# Patient Record
Sex: Female | Born: 1995 | Race: Black or African American | Hispanic: No | Smoking: Never smoker
Health system: Southern US, Community
[De-identification: ages and names within clinical notes are randomized; demographics above are authoritative.]

## PROBLEM LIST (undated history)

## (undated) DIAGNOSIS — Z789 Other specified health status: Secondary | ICD-10-CM

## (undated) HISTORY — DX: Other specified health status: Z78.9

---
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
## 2014-02-14 ENCOUNTER — Emergency Department (HOSPITAL_COMMUNITY)
Admission: EM | Admit: 2014-02-14 | Discharge: 2014-02-14 | Disposition: A | Discharge status: Home / Self Care | Payer: Medicaid Other | Attending: Emergency Medicine | Admitting: Emergency Medicine

## 2014-02-14 ENCOUNTER — Encounter (HOSPITAL_COMMUNITY): Payer: Self-pay | Admitting: Emergency Medicine

## 2014-02-14 DIAGNOSIS — L03119 Cellulitis of unspecified part of limb: Principal | ICD-10-CM

## 2014-02-14 DIAGNOSIS — L0291 Cutaneous abscess, unspecified: Secondary | ICD-10-CM

## 2014-02-14 DIAGNOSIS — L02419 Cutaneous abscess of limb, unspecified: Secondary | ICD-10-CM | POA: Insufficient documentation

## 2014-02-14 MED ORDER — SULFAMETHOXAZOLE-TRIMETHOPRIM 800-160 MG PO TABS: 1.0000 | ORAL_TABLET | Freq: Two times a day (BID) | ORAL | Status: DC

## 2014-02-14 NOTE — ED Notes (Signed)
Pt states she thinks she has an abscess to rt inner thigh.

## 2014-02-14 NOTE — ED Notes (Signed)
Pt reports abscess to r inner thigh x 2 days.

## 2014-02-14 NOTE — ED Provider Notes (Signed)
CSN: [id number]     Arrival date & time 02/14/14  1536 History  This chart was scribed for Maudry Diego, MD by Martinique Peace, ED scribe.  This patient was seen in room APA17/APA17 and the patient's care was started at 4:22 PM.   Chief Complaint  Patient presents with  . Abscess    Patient is a 18 y.o. female presenting with abscess. The history is provided by the patient. No language interpreter was used.  Abscess Location:  Leg Leg abscess location:  R upper leg Size:  1 cm Abscess quality: induration and painful   Duration:  2 days Progression:  Worsening Chronicity:  New   HPI Comments: Samara A Timberman is a 18 y.o. female who presents to the Emergency Department complaining of an abscess on the upper inner thigh of her right leg onset 2 days ago. She has no prior history of similar symptoms. She also denies any chronic medical conditions.   History reviewed. No pertinent past medical history.  History reviewed. No pertinent past surgical history.  No family history on file.   History  Substance Use Topics  . Smoking status: Never Smoker   . Smokeless tobacco: Not on file  . Alcohol Use: No    OB History   Grav Para Term Preterm Abortions TAB SAB Ect Mult Living                   Review of Systems  Constitutional: Negative for appetite change.  HENT: Negative for congestion, ear discharge and sinus pressure.   Eyes: Negative for discharge.  Respiratory: Negative for cough.   Cardiovascular: Negative for chest pain.  Gastrointestinal: Negative for abdominal pain and diarrhea.  Genitourinary: Negative for frequency and hematuria.  Musculoskeletal: Negative for back pain.  Skin: Positive for wound (abscess). Negative for rash.  Neurological: Negative for seizures.  Psychiatric/Behavioral: Negative for hallucinations.      Allergies  Review of patient's allergies indicates no known allergies.  Home Medications   Prior to Admission medications   Not on  File   BP 98/64  Pulse 72  Temp(Src) 99.7 F (37.6 C) (Oral)  Resp 18  Ht 5\' 6"  (1.676 m)  Wt 120 lb (54.432 kg)  BMI 19.38 kg/m2  SpO2 100%  LMP 02/03/2014  Physical Exam  Constitutional: She is oriented to person, place, and time. She appears well-developed.  HENT:  Head: Normocephalic.  Eyes: Conjunctivae are normal.  Neck: No tracheal deviation present.  Cardiovascular:  No murmur heard. Musculoskeletal: Normal range of motion.  Neurological: She is oriented to person, place, and time.  Skin: Skin is warm.  1 cm indurated tender area in her right nner thigh superior region. Consistent with an abscess.   Psychiatric: She has a normal mood and affect.    ED Course  Procedures (including critical care time)  DIAGNOSTIC STUDIES: Oxygen Saturation is 100% on room air, normal by my interpretation.    COORDINATION OF CARE: 4:26 PM-Discussed treatment plan which includes I & D with pt at bedside and pt agreed to plan.     Labs Review Labs Reviewed - No data to display  Imaging Review No results found.   EKG Interpretation None      MDM   Final diagnoses:  None   The chart was scribed for me under my direct supervision.  I personally performed the history, physical, and medical decision making and all procedures in the evaluation of this patient.Elwyn Lade  Roderic Palau, MD 02/15/14 1534

## 2014-02-14 NOTE — Discharge Instructions (Signed)
Follow up in two days.  Clean area twice a day with soap and water,  Tylenol or motrin for pain

## 2014-02-14 NOTE — ED Notes (Signed)
Telfa, 4x4s and tape applied to wound.

## 2014-02-16 ENCOUNTER — Emergency Department (HOSPITAL_COMMUNITY)
Admission: EM | Admit: 2014-02-16 | Discharge: 2014-02-16 | Disposition: A | Discharge status: Home / Self Care | Payer: Medicaid Other | Attending: Emergency Medicine | Admitting: Emergency Medicine

## 2014-02-16 ENCOUNTER — Encounter (HOSPITAL_COMMUNITY): Payer: Self-pay | Admitting: Emergency Medicine

## 2014-02-16 DIAGNOSIS — Z4801 Encounter for change or removal of surgical wound dressing: Secondary | ICD-10-CM | POA: Insufficient documentation

## 2014-02-16 DIAGNOSIS — Z09 Encounter for follow-up examination after completed treatment for conditions other than malignant neoplasm: Secondary | ICD-10-CM

## 2014-02-16 DIAGNOSIS — Z79899 Other long term (current) drug therapy: Secondary | ICD-10-CM | POA: Insufficient documentation

## 2014-02-16 NOTE — ED Provider Notes (Signed)
Medical screening examination/treatment/procedure(s) were performed by non-physician practitioner and as supervising physician I was immediately available for consultation/collaboration.  Richarda Blade, MD 02/16/14 2352

## 2014-02-16 NOTE — ED Provider Notes (Signed)
CSN: [id number]     Arrival date & time 02/16/14  1708 History   First MD Initiated Contact with Patient 02/16/14 1853     Chief Complaint  Patient presents with  . Abscess     (Consider location/radiation/quality/duration/timing/severity/associated sxs/prior Treatment) Patient is a 18 y.o. female presenting with abscess. The history is provided by the patient.  Abscess Location:  Leg Leg abscess location:  R upper leg Abscess quality: draining   Red streaking: no    Eliot A Bogucki is a 18 y.o. female who presents to the ED for recheck of abscess to the inner aspect of the right thigh. She was here 2 days ago and had the area drained. Patient was concerned today because the area around the abscess felt hard.   History reviewed. No pertinent past medical history. History reviewed. No pertinent past surgical history. No family history on file. History  Substance Use Topics  . Smoking status: Never Smoker   . Smokeless tobacco: Not on file  . Alcohol Use: No   OB History   Grav Para Term Preterm Abortions TAB SAB Ect Mult Living                 Review of Systems Negative except as stated in HPI   Allergies  Review of patient's allergies indicates no known allergies.  Home Medications   Prior to Admission medications   Medication Sig Start Date End Date Taking? Authorizing Provider  sulfamethoxazole-trimethoprim (SEPTRA DS) 800-160 MG per tablet Take 1 tablet by mouth 2 (two) times daily. 02/14/14   Maudry Diego, MD   BP 117/63  Pulse 96  Temp(Src) 98.6 F (37 C) (Oral)  Resp 20  Ht 5\' 6"  (1.676 m)  Wt 120 lb (54.432 kg)  BMI 19.38 kg/m2  SpO2 99%  LMP 02/03/2014 Physical Exam  Nursing note and vitals reviewed. Constitutional: She is oriented to person, place, and time. She appears well-developed and well-nourished.  HENT:  Head: Normocephalic.  Eyes: EOM are normal.  Neck: Neck supple.  Cardiovascular: Normal rate.   Pulmonary/Chest: Effort normal.    Musculoskeletal: Normal range of motion.       Right upper leg: She exhibits tenderness.       Legs: Inner aspect of the right thigh with draining abscess. There is a firm area surrounding the wound that is mildly tender on exam. No red streaking.   Neurological: She is alert and oriented to person, place, and time. No cranial nerve deficit.  Skin: Skin is warm and dry.  Psychiatric: She has a normal mood and affect. Her behavior is normal.    ED Course  Procedures   MDM  18 y.o. female with improving abscess of right thigh s/p I&D 2 days ago. She will continue to take her antibiotics and ibuprofen and will apply warm wet compresses to the area or sit in warm tubs of water. She will return for worsening symptoms.    Medication List    ASK your doctor about these medications       sulfamethoxazole-trimethoprim 800-160 MG per tablet  Commonly known as:  SEPTRA DS  Take 1 tablet by mouth 2 (two) times daily.           [place], [place] 02/16/14 [phone]

## 2014-02-16 NOTE — Discharge Instructions (Signed)
Apply warm wet compresses to the area or sit in warm tubs of water. Continue the antibiotics. Return for any problems.

## 2014-02-16 NOTE — ED Notes (Signed)
Pt has abscess to right inner thigh

## 2014-05-19 ENCOUNTER — Encounter (HOSPITAL_COMMUNITY): Payer: Self-pay | Admitting: Emergency Medicine

## 2014-05-19 ENCOUNTER — Emergency Department (HOSPITAL_COMMUNITY)
Admission: EM | Admit: 2014-05-19 | Discharge: 2014-05-20 | Disposition: A | Discharge status: Home / Self Care | Payer: Self-pay | Attending: Emergency Medicine | Admitting: Emergency Medicine

## 2014-05-19 DIAGNOSIS — Z3202 Encounter for pregnancy test, result negative: Secondary | ICD-10-CM | POA: Insufficient documentation

## 2014-05-19 DIAGNOSIS — N63 Unspecified lump in unspecified breast: Secondary | ICD-10-CM | POA: Insufficient documentation

## 2014-05-19 DIAGNOSIS — R000 Tachycardia, unspecified: Secondary | ICD-10-CM | POA: Insufficient documentation

## 2014-05-19 DIAGNOSIS — F411 Generalized anxiety disorder: Secondary | ICD-10-CM | POA: Insufficient documentation

## 2014-05-19 DIAGNOSIS — Z792 Long term (current) use of antibiotics: Secondary | ICD-10-CM | POA: Insufficient documentation

## 2014-05-19 DIAGNOSIS — N6310 Unspecified lump in the right breast, unspecified quadrant: Secondary | ICD-10-CM

## 2014-05-19 DIAGNOSIS — R002 Palpitations: Secondary | ICD-10-CM | POA: Insufficient documentation

## 2014-05-19 DIAGNOSIS — F419 Anxiety disorder, unspecified: Secondary | ICD-10-CM

## 2014-05-19 DIAGNOSIS — R259 Unspecified abnormal involuntary movements: Secondary | ICD-10-CM | POA: Insufficient documentation

## 2014-05-19 MED ORDER — ALPRAZOLAM 0.5 MG PO TABS
0.2500 mg | ORAL_TABLET | Freq: Once | ORAL | Status: AC
  Administered 2014-05-20: 0.25 mg via ORAL
  Filled 2014-05-19: qty 1
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
## 2014-05-20 LAB — CBC WITH DIFFERENTIAL/PLATELET
BASOS ABS: 0 10*3/uL (ref 0.0–0.1)
BASOS PCT: 1 % (ref 0–1)
Eosinophils Absolute: 0.3 10*3/uL (ref 0.0–0.7)
Eosinophils Relative: 4 % (ref 0–5)
HEMATOCRIT: 32.6 % — AB (ref 36.0–46.0)
HEMOGLOBIN: 11 g/dL — AB (ref 12.0–15.0)
LYMPHS PCT: 36 % (ref 12–46)
Lymphs Abs: 2.6 10*3/uL (ref 0.7–4.0)
MCH: 26.9 pg (ref 26.0–34.0)
MCHC: 33.7 g/dL (ref 30.0–36.0)
MCV: 79.7 fL (ref 78.0–100.0)
MONO ABS: 0.6 10*3/uL (ref 0.1–1.0)
Monocytes Relative: 8 % (ref 3–12)
NEUTROS ABS: 3.8 10*3/uL (ref 1.7–7.7)
NEUTROS PCT: 51 % (ref 43–77)
Platelets: 226 10*3/uL (ref 150–400)
RBC: 4.09 MIL/uL (ref 3.87–5.11)
RDW: 12.8 % (ref 11.5–15.5)
WBC: 7.3 10*3/uL (ref 4.0–10.5)

## 2014-05-20 LAB — URINALYSIS, ROUTINE W REFLEX MICROSCOPIC
BILIRUBIN URINE: NEGATIVE
GLUCOSE, UA: NEGATIVE mg/dL
KETONES UR: NEGATIVE mg/dL
LEUKOCYTES UA: NEGATIVE
Nitrite: NEGATIVE
PH: 6 (ref 5.0–8.0)
PROTEIN: NEGATIVE mg/dL
Specific Gravity, Urine: 1.01 (ref 1.005–1.030)
Urobilinogen, UA: 0.2 mg/dL (ref 0.0–1.0)

## 2014-05-20 LAB — COMPREHENSIVE METABOLIC PANEL
ALBUMIN: 3.9 g/dL (ref 3.5–5.2)
ALK PHOS: 45 U/L (ref 39–117)
ALT: 6 U/L (ref 0–35)
AST: 14 U/L (ref 0–37)
Anion gap: 11 (ref 5–15)
BILIRUBIN TOTAL: 0.8 mg/dL (ref 0.3–1.2)
BUN: 8 mg/dL (ref 6–23)
CHLORIDE: 105 meq/L (ref 96–112)
CO2: 23 meq/L (ref 19–32)
Calcium: 9.1 mg/dL (ref 8.4–10.5)
Creatinine, Ser: 0.73 mg/dL (ref 0.50–1.10)
GFR calc Af Amer: >90 mL/min (ref >90)
Glucose, Bld: 146 mg/dL — ABNORMAL HIGH (ref 70–99)
POTASSIUM: 3.9 meq/L (ref 3.7–5.3)
SODIUM: 139 meq/L (ref 137–147)
Total Protein: 7.8 g/dL (ref 6.0–8.3)

## 2014-05-20 LAB — PREGNANCY, URINE: Preg Test, Ur: NEGATIVE

## 2014-05-20 LAB — ETHANOL: Alcohol, Ethyl (B): 31 mg/dL — ABNORMAL HIGH (ref 0–11)

## 2014-05-20 LAB — RAPID URINE DRUG SCREEN, HOSP PERFORMED
Amphetamines: NOT DETECTED
BENZODIAZEPINES: NOT DETECTED
Barbiturates: NOT DETECTED
COCAINE: NOT DETECTED
OPIATES: NOT DETECTED
TETRAHYDROCANNABINOL: NOT DETECTED

## 2014-05-20 LAB — URINE MICROSCOPIC-ADD ON

## 2014-05-20 MED ORDER — ALPRAZOLAM 0.25 MG PO TABS: 0.2500 mg | ORAL_TABLET | Freq: Three times a day (TID) | ORAL | Status: DC | PRN

## 2014-05-20 NOTE — Discharge Instructions (Signed)
Call and make an appointment to followup with her primary physician regarding your breast masses. You may need to have a mammogram performed. With excessive alcohol intake. Return for worsening palpitations, chest pain or for any concerns.

## 2014-05-24 ENCOUNTER — Ambulatory Visit: Payer: Medicaid Other | Admitting: Pediatrics

## 2014-06-03 ENCOUNTER — Other Ambulatory Visit (HOSPITAL_COMMUNITY): Payer: Self-pay | Admitting: Nurse Practitioner

## 2014-06-03 DIAGNOSIS — N6310 Unspecified lump in the right breast, unspecified quadrant: Secondary | ICD-10-CM

## 2014-06-11 ENCOUNTER — Ambulatory Visit (HOSPITAL_COMMUNITY)
Admission: RE | Admit: 2014-06-11 | Discharge: 2014-06-11 | Disposition: A | Discharge status: Home / Self Care | Payer: Self-pay | Source: Ambulatory Visit | Attending: Nurse Practitioner | Admitting: Nurse Practitioner

## 2014-06-11 ENCOUNTER — Other Ambulatory Visit (HOSPITAL_COMMUNITY): Payer: Self-pay | Admitting: Nurse Practitioner

## 2014-06-11 DIAGNOSIS — N6310 Unspecified lump in the right breast, unspecified quadrant: Secondary | ICD-10-CM

## 2014-06-11 DIAGNOSIS — N63 Unspecified lump in unspecified breast: Secondary | ICD-10-CM | POA: Insufficient documentation

## 2014-07-10 ENCOUNTER — Encounter (HOSPITAL_COMMUNITY): Payer: Self-pay | Admitting: Emergency Medicine

## 2014-07-10 ENCOUNTER — Emergency Department (HOSPITAL_COMMUNITY)
Admission: EM | Admit: 2014-07-10 | Discharge: 2014-07-10 | Disposition: A | Discharge status: Home / Self Care | Payer: Self-pay | Attending: Emergency Medicine | Admitting: Emergency Medicine

## 2014-07-10 DIAGNOSIS — Z79899 Other long term (current) drug therapy: Secondary | ICD-10-CM | POA: Insufficient documentation

## 2014-07-10 DIAGNOSIS — L729 Follicular cyst of the skin and subcutaneous tissue, unspecified: Secondary | ICD-10-CM

## 2014-07-10 DIAGNOSIS — L738 Other specified follicular disorders: Secondary | ICD-10-CM | POA: Insufficient documentation

## 2014-07-10 DIAGNOSIS — Z792 Long term (current) use of antibiotics: Secondary | ICD-10-CM | POA: Insufficient documentation

## 2014-07-10 DIAGNOSIS — L739 Follicular disorder, unspecified: Secondary | ICD-10-CM

## 2014-07-10 MED ORDER — MUPIROCIN 2 % EX OINT: TOPICAL_OINTMENT | CUTANEOUS | Status: DC

## 2014-07-10 MED ORDER — SULFAMETHOXAZOLE-TRIMETHOPRIM 800-160 MG PO TABS: 1.0000 | ORAL_TABLET | Freq: Two times a day (BID) | ORAL | Status: DC

## 2014-07-10 NOTE — Discharge Instructions (Signed)

## 2014-07-10 NOTE — ED Notes (Signed)
"  I have a sore in my nose"

## 2014-07-11 NOTE — ED Provider Notes (Signed)
CSN: [id number]     Arrival date & time 07/10/14  1620 History   First MD Initiated Contact with Patient 07/10/14 1803     Chief Complaint  Patient presents with  . Nose Problem    sore in right nare     (Consider location/radiation/quality/duration/timing/severity/associated sxs/prior Treatment) HPI   Joan Rose is a 18 y.o. female who presents to the Emergency Department complaining of "sore" inside her right nostril.  She states she noticed tenderness to touch of her right nostril that began one day prior to ED arrival.  She has not tried any therapies prior to ED arrival.  She denies using any new nasal medications or drug use, fever, chills, facial swelling or redness of the face.     History reviewed. No pertinent past medical history. History reviewed. No pertinent past surgical history. History reviewed. No pertinent family history. History  Substance Use Topics  . Smoking status: Never Smoker   . Smokeless tobacco: Not on file  . Alcohol Use: No   OB History   Grav Para Term Preterm Abortions TAB SAB Ect Mult Living                 Review of Systems  Constitutional: Negative for fever and chills.  HENT: Negative for congestion, facial swelling, mouth sores, nosebleeds, rhinorrhea and trouble swallowing.   Gastrointestinal: Negative for nausea and vomiting.  Musculoskeletal: Negative for arthralgias and joint swelling.  Skin: Positive for color change.       Pain and "sore" to the inside right nostril  Hematological: Negative for adenopathy.  All other systems reviewed and are negative.     Allergies  Review of patient's allergies indicates no known allergies.  Home Medications   Prior to Admission medications   Medication Sig Start Date End Date Taking? Authorizing Provider  ALPRAZolam (XANAX) 0.25 MG tablet Take 1 tablet (0.25 mg total) by mouth 3 (three) times daily as needed for anxiety. 05/20/14   Julianne Rice, MD  mupirocin ointment (BACTROBAN) 2  % Apply to the inside of the right nostril TID x 10 days 07/10/14   Sylvester Minton L. Faatima Tench, PA-C  sulfamethoxazole-trimethoprim (SEPTRA DS) 800-160 MG per tablet Take 1 tablet by mouth 2 (two) times daily. 02/14/14   Maudry Diego, MD  sulfamethoxazole-trimethoprim (SEPTRA DS) 800-160 MG per tablet Take 1 tablet by mouth 2 (two) times daily. For 10 days 07/10/14   Anterrio Mccleery L. Fatih Stalvey, PA-C   BP 110/57  Pulse 83  Temp(Src) 99.1 F (37.3 C) (Oral)  Resp 16  Ht 5\' 6"  (1.676 m)  Wt 120 lb (54.432 kg)  BMI 19.38 kg/m2  SpO2 97%  LMP 06/20/2014 Physical Exam  Nursing note and vitals reviewed. Constitutional: She is oriented to person, place, and time. She appears well-developed and well-nourished. No distress.  HENT:  Head: Normocephalic and atraumatic.  Nose: Sinus tenderness present. No mucosal edema or rhinorrhea. No epistaxis.  Neck: Normal range of motion. Neck supple.  Cardiovascular: Normal rate, regular rhythm and normal heart sounds.   No murmur heard. Pulmonary/Chest: Effort normal and breath sounds normal. No respiratory distress.  Musculoskeletal: Normal range of motion.  Lymphadenopathy:    She has no cervical adenopathy.  Neurological: She is alert and oriented to person, place, and time. She exhibits normal muscle tone. Coordination normal.  Skin: Skin is warm and dry. There is erythema.  Single, small pustule to the inner right nostril.  No edema or erythema noted to the face or external  nostril.      ED Course  Procedures (including critical care time) Labs Review Labs Reviewed - No data to display  Imaging Review No results found.   EKG Interpretation None      MDM   Final diagnoses:  Nasal folliculitis    Pt is well appearing. VSS.  Small pustule to the inner right nostril.  No external edema or erythema of the nose.  She agrees to warm compresses, rx for bactrim and bactroban.      Laaibah Wartman L. Vanessa [place], PA-C 07/11/14 1658

## 2014-07-11 NOTE — ED Provider Notes (Signed)
Medical screening examination/treatment/procedure(s) were performed by non-physician practitioner and as supervising physician I was immediately available for consultation/collaboration.   EKG Interpretation None        Maudry Diego, MD 07/11/14 1705

## 2014-07-13 ENCOUNTER — Encounter (HOSPITAL_COMMUNITY): Payer: Self-pay | Admitting: Emergency Medicine

## 2014-07-13 ENCOUNTER — Emergency Department (HOSPITAL_COMMUNITY)
Admission: EM | Admit: 2014-07-13 | Discharge: 2014-07-13 | Disposition: A | Discharge status: Home / Self Care | Payer: Self-pay | Attending: Emergency Medicine | Admitting: Emergency Medicine

## 2014-07-13 DIAGNOSIS — L739 Follicular disorder, unspecified: Secondary | ICD-10-CM

## 2014-07-13 DIAGNOSIS — L728 Other follicular cysts of the skin and subcutaneous tissue: Secondary | ICD-10-CM | POA: Insufficient documentation

## 2014-07-13 DIAGNOSIS — Z792 Long term (current) use of antibiotics: Secondary | ICD-10-CM | POA: Insufficient documentation

## 2014-07-13 DIAGNOSIS — R0982 Postnasal drip: Secondary | ICD-10-CM | POA: Insufficient documentation

## 2014-07-13 NOTE — ED Notes (Signed)
Noticed cyst on R upper leg 3 days ago.  Has had similar problem before which required lancing and wants it to be checked so it doesn't get as bad as the last one.

## 2014-07-13 NOTE — ED Provider Notes (Signed)
CSN: [id number]     Arrival date & time 07/13/14  1605 History   First MD Initiated Contact with Patient 07/13/14 1617     Chief Complaint  Patient presents with  . Cyst     (Consider location/radiation/quality/duration/timing/severity/associated sxs/prior Treatment) HPI  Joan Rose is a 18 y.o. female who presents to the Emergency Department for the second time this week, complaining of a red, tender area to her right thigh that she noticed three days ago.  She was seen here by me on 07/10/14 for a pustule to her right nostril and was prescribed Bactrim and Bactroban which she has not gotten filled yet.  She states she had a similar "cyst" on the same thigh in the past and states that she wanted to have it checked.  She denies fever, vomiting, chills or swelling.    History reviewed. No pertinent past medical history. History reviewed. No pertinent past surgical history. History reviewed. No pertinent family history. History  Substance Use Topics  . Smoking status: Never Smoker   . Smokeless tobacco: Not on file  . Alcohol Use: No   OB History   Grav Para Term Preterm Abortions TAB SAB Ect Mult Living                 Review of Systems  Constitutional: Negative for fever and chills.  Gastrointestinal: Negative for nausea, vomiting and abdominal pain.  Genitourinary: Negative for dysuria.  Musculoskeletal: Negative for arthralgias and joint swelling.  Skin: Positive for color change.       Localized area tenderness to the right thigh  Neurological: Negative for weakness and numbness.  Hematological: Negative for adenopathy.  All other systems reviewed and are negative.     Allergies  Review of patient's allergies indicates no known allergies.  Home Medications   Prior to Admission medications   Medication Sig Start Date End Date Taking? Authorizing Provider  ALPRAZolam (XANAX) 0.25 MG tablet Take 1 tablet (0.25 mg total) by mouth 3 (three) times daily as needed  for anxiety. 05/20/14   Julianne Rice, MD  mupirocin ointment (BACTROBAN) 2 % Apply to the inside of the right nostril TID x 10 days 07/10/14   Artemus Romanoff L. Tsuneo Faison, PA-C  sulfamethoxazole-trimethoprim (SEPTRA DS) 800-160 MG per tablet Take 1 tablet by mouth 2 (two) times daily. 02/14/14   Maudry Diego, MD  sulfamethoxazole-trimethoprim (SEPTRA DS) 800-160 MG per tablet Take 1 tablet by mouth 2 (two) times daily. For 10 days 07/10/14   Trelyn Vanderlinde L. Elta Angell, PA-C   BP 102/57  Pulse 73  Temp(Src) 99.1 F (37.3 C) (Oral)  Resp 20  Ht 5\' 6"  (1.676 m)  Wt 120 lb (54.432 kg)  BMI 19.38 kg/m2  SpO2 97%  LMP 06/20/2014 Physical Exam  Nursing note and vitals reviewed. Constitutional: She is oriented to person, place, and time. She appears well-developed and well-nourished. No distress.  HENT:  Head: Normocephalic and atraumatic.  Pustule to the mucosa of the right nostril appears to be improving, mild purulent drainage noted.  No erythema  Cardiovascular: Normal rate, regular rhythm and normal heart sounds.   No murmur heard. Pulmonary/Chest: Effort normal and breath sounds normal. No respiratory distress.  Neurological: She is alert and oriented to person, place, and time. She exhibits normal muscle tone. Coordination normal.  Skin: Skin is warm and dry. There is erythema.  Localized area of tenderness to the anterior right thigh.  No induration or flunctuance    ED Course  Procedures (including  critical care time) Labs Review Labs Reviewed - No data to display  Imaging Review No results found.   EKG Interpretation None      MDM   Final diagnoses:  Folliculitis    Pt seen here by me three days ago, has not gotten her medications filled.  She is well appearing, non-toxic.  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  I have advised her that she will need to get abx filled and continue warm compresses.  She agrees to plan and appears stable for  d/c    Kharson Rasmusson L. Vanessa [place], PA-C 07/13/14 1657

## 2014-07-13 NOTE — ED Provider Notes (Signed)
Medical screening examination/treatment/procedure(s) were performed by non-physician practitioner and as supervising physician I was immediately available for consultation/collaboration.   EKG Interpretation None        Maudry Diego, MD 07/13/14 1739

## 2014-07-13 NOTE — Discharge Instructions (Signed)

## 2014-09-13 NOTE — L&D Delivery Note (Signed)
Patient is 19 y.o. G1P0 [redacted]w[redacted]d admitted for severe pre-eclampsia with facial/UE swelling and HA. She was induced with cytotec x 3 and a foley bulb. Patient labored on her own until she was found to be complete. She received Mag infusion, starting 0200 12/12. She also was GBS unknown, so received adequate treatment with PCN. BPs were well controlled throughout labor and delivery and she only required hydralazine x 1.    Delivery Note At 8:00 PM a viable female was delivered via  (Presentation: Direct OA).  APGAR: 7, 7; weight 4 lb 6.4 oz (1997 g).   Placenta status: Spontaneous, intact.  Cord: 3 vessel cord.  Cord pH: 7.27.  Anesthesia:  Epidural Episiotomy:  None Lacerations:  2nd degree perineal Suture Repair: 3.0 monocryl Est. Blood Loss (mL):    Mom to postpartum.  Baby to NICU.  Stormy Card 08/25/2015, 8:21 PM

## 2014-09-25 NOTE — Patient Instructions (Signed)
Joan Rose  09/25/2014   Your procedure is scheduled on: 09/30/2014  Report to [facility] at  53  AM.  Call this number if you have problems the morning of surgery: [phone]   Remember:   Do not eat food or drink liquids after midnight.   Take these medicines the morning of surgery with A SIP OF WATER:  xanax   Do not wear jewelry, make-up or nail polish.  Do not wear lotions, powders, or perfumes.  Do not shave 48 hours prior to surgery. Men may shave face and neck.  Do not bring valuables to the hospital.  [facility] is not responsible for any belongings or valuables.               Contacts, dentures or bridgework may not be worn into surgery.  Leave suitcase in the car. After surgery it may be brought to your room.  For patients admitted to the hospital, discharge time is determined by your treatment team.               Patients discharged the day of surgery will not be allowed to drive home.  Name and phone number of your driver: family  Special Instructions: Shower using CHG 2 nights before surgery and the night before surgery.  If you shower the day of surgery use CHG.  Use special wash - you have one bottle of CHG for all showers.  You should use approximately 1/3 of the bottle for each shower.   Please read over the following fact sheets that you were given: Pain Booklet, Coughing and Deep Breathing, Surgical Site Infection Prevention, Anesthesia Post-op Instructions and Care and Recovery After Surgery Breast Biopsy A breast biopsy is a test during which a sample of tissue is taken from your breast. The breast tissue is looked at under a microscope for cancer cells.  BEFORE THE PROCEDURE  Make plans to have someone drive you home after the test.  Do not smoke for 2 weeks before the test. Stop smoking, if you smoke.  Do not drink alcohol for 24 hours before the test.  Wear a good support bra to the test. PROCEDURE  You may be given one of the  following:  A medicine to numb the breast area (local anesthetic).  A medicine to make you fall asleep (general anesthetic). There are different types of breast biopsies. They include:  Fine-needle aspiration.  A needle is put into the breast lump.  The needle takes out fluid and cells from the lump.  Ultrasound imaging may be used to help find the lump and to put the needle in the right spot.  Core-needle biopsy.  A needle is put into the breast lump.  The needle is put in your breast 3-6 times.  The needle removes breast tissue.  An ultrasound image or X-ray is often used to find the right spot to put in the needle.  Stereotactic biopsy.  X-rays and a computer are used to study X-ray pictures of the breast lump.  The computer finds where the needle needs to be put into the breast.  Tissue samples are taken out.  Vacuum-assisted biopsy.  A small cut (incision) is made in your breast.  A biopsy device is put through the cut and into the breast tissue.  The biopsy device draws abnormal breast tissue into the biopsy device.  A large tissue sample is often removed.  No stitches are needed.  Ultrasound-guided core-needle  biopsy.  Ultrasound imaging helps guide the needle into the area of the breast that is not normal.  A cut is made in the breast. The needle is put into the breast lump.  Tissue samples are taken out.  Open biopsy.  A large cut is made in the breast.  Your doctor will try to remove the whole breast lump or as much as possible. All tissue, fluid, or cell samples are looked at under a microscope.  AFTER THE PROCEDURE  You will be taken to an area to recover. You will be able to go home once you are doing well and are without problems.  You may have bruising on your breast. This is normal.  A pressure bandage (dressing) may be put on your breast for 24-48 hours. This type of bandage is wrapped tightly around your chest. It helps stop fluid  from building up underneath tissues. Document Released: 11/22/2011 Document Revised: 01/14/2014 Document Reviewed: 11/22/2011 [facility] Patient Information 2015 [place], [place]. This information is not intended to replace advice given to you by your health care provider. Make sure you discuss any questions you have with your health care provider. PATIENT INSTRUCTIONS POST-ANESTHESIA  IMMEDIATELY FOLLOWING SURGERY:  Do not drive or operate machinery for the first twenty four hours after surgery.  Do not make any important decisions for twenty four hours after surgery or while taking narcotic pain medications or sedatives.  If you develop intractable nausea and vomiting or a severe headache please notify your doctor immediately.  FOLLOW-UP:  Please make an appointment with your surgeon as instructed. You do not need to follow up with anesthesia unless specifically instructed to do so.  WOUND CARE INSTRUCTIONS (if applicable):  Keep a dry clean dressing on the anesthesia/puncture wound site if there is drainage.  Once the wound has quit draining you may leave it open to air.  Generally you should leave the bandage intact for twenty four hours unless there is drainage.  If the epidural site drains for more than 36-48 hours please call the anesthesia department.  QUESTIONS?:  Please feel free to call your physician or the hospital operator if you have any questions, and they will be happy to assist you.

## 2014-09-26 ENCOUNTER — Encounter (HOSPITAL_COMMUNITY)
Admission: RE | Admit: 2014-09-26 | Discharge: 2014-09-26 | Disposition: A | Discharge status: Home / Self Care | Payer: Self-pay | Source: Ambulatory Visit | Attending: General Surgery | Admitting: General Surgery

## 2014-09-26 ENCOUNTER — Encounter (HOSPITAL_COMMUNITY): Payer: Self-pay

## 2014-09-26 DIAGNOSIS — Z01818 Encounter for other preprocedural examination: Secondary | ICD-10-CM | POA: Insufficient documentation

## 2014-09-26 DIAGNOSIS — N63 Unspecified lump in breast: Secondary | ICD-10-CM | POA: Insufficient documentation

## 2014-09-26 LAB — BASIC METABOLIC PANEL
Anion gap: 4 — ABNORMAL LOW (ref 5–15)
BUN: 17 mg/dL (ref 6–23)
CHLORIDE: 108 meq/L (ref 96–112)
CO2: 26 mmol/L (ref 19–32)
CREATININE: 0.63 mg/dL (ref 0.50–1.10)
Calcium: 9.2 mg/dL (ref 8.4–10.5)
GFR calc Af Amer: >90 mL/min (ref >90)
GFR calc non Af Amer: >90 mL/min (ref >90)
Glucose, Bld: 84 mg/dL (ref 70–99)
Potassium: 4.2 mmol/L (ref 3.5–5.1)
Sodium: 138 mmol/L (ref 135–145)

## 2014-09-26 LAB — CBC WITH DIFFERENTIAL/PLATELET
BASOS ABS: 0 10*3/uL (ref 0.0–0.1)
Basophils Relative: 1 % (ref 0–1)
EOS ABS: 0.2 10*3/uL (ref 0.0–0.7)
Eosinophils Relative: 5 % (ref 0–5)
HCT: 33.3 % — ABNORMAL LOW (ref 36.0–46.0)
HEMOGLOBIN: 11.1 g/dL — AB (ref 12.0–15.0)
LYMPHS ABS: 2 10*3/uL (ref 0.7–4.0)
Lymphocytes Relative: 43 % (ref 12–46)
MCH: 27.3 pg (ref 26.0–34.0)
MCHC: 33.3 g/dL (ref 30.0–36.0)
MCV: 81.8 fL (ref 78.0–100.0)
MONO ABS: 0.5 10*3/uL (ref 0.1–1.0)
Monocytes Relative: 11 % (ref 3–12)
NEUTROS PCT: 40 % — AB (ref 43–77)
Neutro Abs: 1.8 10*3/uL (ref 1.7–7.7)
Platelets: 207 10*3/uL (ref 150–400)
RBC: 4.07 MIL/uL (ref 3.87–5.11)
RDW: 13.1 % (ref 11.5–15.5)
WBC: 4.6 10*3/uL (ref 4.0–10.5)

## 2014-09-26 LAB — HCG, SERUM, QUALITATIVE: PREG SERUM: NEGATIVE

## 2014-09-26 NOTE — Pre-Procedure Instructions (Signed)
Patient given information to sign up for my chart at home. 

## 2014-09-27 NOTE — OR Nursing (Signed)
EKG  Done on 9/56/2015  Showed to dr. Patsey Berthold.  Rate 127,  Will check patient on arrival to preop.  PAT hr was 70

## 2014-09-28 NOTE — H&P (Signed)
  NTS SOAP Note  Vital Signs:  Vitals as of: 0/12/5995: Systolic 741: Diastolic 61: Heart Rate 71: Temp 98.65F: Height 27ft 6in: Weight 121Lbs 0 Ounces: BMI 19.53  BMI : 19.53 kg/m2  Subjective: This 19 year old female presents for of a right breast lump.  Has been present for some time,  but seems to be increasing in size.  Also found another smaller lump in the same right breast recently.  No family h/o breast cancer.  Review of Symptoms:  Constitutional:unremarkable   Head:unremarkable Eyes:unremarkable   Nose/Mouth/Throat:unremarkable Cardiovascular:  unremarkable Respiratory:unremarkable Gastrointestinal:  unremarkable   Genitourinary:unremarkable   Musculoskeletal:unremarkable Skin:unremarkable Hematolgic/Lymphatic:unremarkable   Allergic/Immunologic:unremarkable   Past Medical History:  Reviewed  Past Medical History  Surgical History: none Medical Problems: none Allergies: nkda Medications: none   Social History:Reviewed  Social History  Preferred Language: English Race:  Black or African American Ethnicity: Not Hispanic / Latino Age: 11 year Marital Status:  S Alcohol: no   Smoking Status: Never smoker reviewed on 08/20/2014 Functional Status reviewed on 08/20/2014 ------------------------------------------------ Bathing: Normal Cooking: Normal Dressing: Normal Driving: Normal Eating: Normal Managing Meds: Normal Oral Care: Normal Shopping: Normal Toileting: Normal Transferring: Normal Walking: Normal Cognitive Status reviewed on 08/20/2014 ------------------------------------------------ Attention: Normal Decision Making: Normal Language: Normal Memory: Normal Motor: Normal Perception: Normal Problem Solving: Normal Visual and Spatial: Normal   Family History:Reviewed  Family Health History Mother, Living; Healthy;  Father, Living; Healthy;     Objective Information: General:Well appearing, well  nourished in no distress. Heart:RRR, no murmur or gallop.  Normal S1, S2.  No S3, S4.  Right breast with large oval mass in the lower outer quadrant involving a significant portion of the breast.  Small mobile 2cm mass noted in the upper,  outer quadrant of the right breast.  Right breast larger than the left breast.  Axilla negative.  Left breast unremarkable. U/S of right breast:  report reviewed. Assessment:Right breast neoplasms x 2   Diagnoses: 611.72  N63 Breast lump (Unspecified lump in breast)  Procedures: 42395 - OFFICE OUTPATIENT NEW 30 MINUTES    Plan:  Does have an enlarging mass in the right breast mass.  Small mass may be a complex cyst or fibroadenoma.  Right breast larger than the left breast.  Discussed surgical excision of both.

## 2014-09-30 ENCOUNTER — Encounter (HOSPITAL_COMMUNITY): Payer: Self-pay | Admitting: Anesthesiology

## 2014-09-30 ENCOUNTER — Encounter (HOSPITAL_COMMUNITY)
Admission: RE | Disposition: A | Discharge status: Home / Self Care | Payer: Self-pay | Source: Ambulatory Visit | Attending: General Surgery

## 2014-09-30 ENCOUNTER — Ambulatory Visit (HOSPITAL_COMMUNITY): Payer: Self-pay | Admitting: Anesthesiology

## 2014-09-30 ENCOUNTER — Ambulatory Visit (HOSPITAL_COMMUNITY)
Admission: RE | Admit: 2014-09-30 | Discharge: 2014-09-30 | Disposition: A | Discharge status: Home / Self Care | Payer: Self-pay | Source: Ambulatory Visit | Attending: General Surgery | Admitting: General Surgery

## 2014-09-30 DIAGNOSIS — D241 Benign neoplasm of right breast: Secondary | ICD-10-CM | POA: Insufficient documentation

## 2014-09-30 HISTORY — PX: BREAST BIOPSY: SHX20

## 2014-09-30 SURGERY — BREAST BIOPSY
Anesthesia: General | Site: Breast | Laterality: Right

## 2014-09-30 MED ORDER — LACTATED RINGERS IV SOLN
INTRAVENOUS | Status: DC
  Administered 2014-09-30: 1000 mL via INTRAVENOUS

## 2014-09-30 MED ORDER — FENTANYL CITRATE 0.05 MG/ML IJ SOLN
INTRAMUSCULAR | Status: AC
  Filled 2014-09-30: qty 2

## 2014-09-30 MED ORDER — BUPIVACAINE HCL (PF) 0.5 % IJ SOLN
INTRAMUSCULAR | Status: AC
  Filled 2014-09-30: qty 30

## 2014-09-30 MED ORDER — KETOROLAC TROMETHAMINE 30 MG/ML IJ SOLN
INTRAMUSCULAR | Status: AC
  Filled 2014-09-30: qty 1

## 2014-09-30 MED ORDER — LIDOCAINE HCL (PF) 1 % IJ SOLN
INTRAMUSCULAR | Status: AC
  Filled 2014-09-30: qty 30

## 2014-09-30 MED ORDER — MIDAZOLAM HCL 2 MG/2ML IJ SOLN
INTRAMUSCULAR | Status: AC
  Filled 2014-09-30: qty 2

## 2014-09-30 MED ORDER — FENTANYL CITRATE 0.05 MG/ML IJ SOLN
25.0000 ug | INTRAMUSCULAR | Status: AC
  Administered 2014-09-30 (×2): 25 ug via INTRAVENOUS

## 2014-09-30 MED ORDER — OXYCODONE-ACETAMINOPHEN 7.5-325 MG PO TABS: 1.0000 | ORAL_TABLET | ORAL | Status: DC | PRN

## 2014-09-30 MED ORDER — 0.9 % SODIUM CHLORIDE (POUR BTL) OPTIME
TOPICAL | Status: DC | PRN
  Administered 2014-09-30: 1000 mL

## 2014-09-30 MED ORDER — PROPOFOL 10 MG/ML IV BOLUS
INTRAVENOUS | Status: DC | PRN
  Administered 2014-09-30: 130 mg via INTRAVENOUS

## 2014-09-30 MED ORDER — BUPIVACAINE HCL (PF) 0.5 % IJ SOLN
INTRAMUSCULAR | Status: DC | PRN
  Administered 2014-09-30: 10 mL

## 2014-09-30 MED ORDER — FENTANYL CITRATE 0.05 MG/ML IJ SOLN
INTRAMUSCULAR | Status: AC
Start: 2014-09-30 — End: 2014-09-30
  Filled 2014-09-30: qty 2

## 2014-09-30 MED ORDER — FENTANYL CITRATE 0.05 MG/ML IJ SOLN: 25.0000 ug | INTRAMUSCULAR | Status: DC | PRN

## 2014-09-30 MED ORDER — KETOROLAC TROMETHAMINE 30 MG/ML IJ SOLN
30.0000 mg | Freq: Once | INTRAMUSCULAR | Status: AC
  Administered 2014-09-30: 30 mg via INTRAVENOUS

## 2014-09-30 MED ORDER — ONDANSETRON HCL 4 MG/2ML IJ SOLN
INTRAMUSCULAR | Status: AC
Start: 2014-09-30 — End: 2014-09-30
  Filled 2014-09-30: qty 2

## 2014-09-30 MED ORDER — LIDOCAINE HCL (CARDIAC) 20 MG/ML IV SOLN
INTRAVENOUS | Status: DC | PRN
  Administered 2014-09-30: 25 mg via INTRAVENOUS

## 2014-09-30 MED ORDER — PROPOFOL 10 MG/ML IV BOLUS
INTRAVENOUS | Status: AC
  Filled 2014-09-30: qty 20

## 2014-09-30 MED ORDER — ARTIFICIAL TEARS OP OINT
TOPICAL_OINTMENT | OPHTHALMIC | Status: AC
  Filled 2014-09-30: qty 3.5

## 2014-09-30 MED ORDER — ONDANSETRON HCL 4 MG/2ML IJ SOLN
4.0000 mg | Freq: Once | INTRAMUSCULAR | Status: AC
  Administered 2014-09-30: 4 mg via INTRAVENOUS

## 2014-09-30 MED ORDER — MIDAZOLAM HCL 2 MG/2ML IJ SOLN
1.0000 mg | INTRAMUSCULAR | Status: DC | PRN
  Administered 2014-09-30: 2 mg via INTRAVENOUS

## 2014-09-30 MED ORDER — LACTATED RINGERS IV SOLN
INTRAVENOUS | Status: DC | PRN
  Administered 2014-09-30 (×2): via INTRAVENOUS

## 2014-09-30 MED ORDER — CHLORHEXIDINE GLUCONATE 4 % EX LIQD: 1.0000 "application " | Freq: Once | CUTANEOUS | Status: DC

## 2014-09-30 MED ORDER — ONDANSETRON HCL 4 MG/2ML IJ SOLN: 4.0000 mg | Freq: Once | INTRAMUSCULAR | Status: DC | PRN

## 2014-09-30 MED ORDER — FENTANYL CITRATE 0.05 MG/ML IJ SOLN
INTRAMUSCULAR | Status: DC | PRN
  Administered 2014-09-30 (×2): 25 ug via INTRAVENOUS
  Administered 2014-09-30: 50 ug via INTRAVENOUS
  Administered 2014-09-30 (×4): 25 ug via INTRAVENOUS

## 2014-09-30 SURGICAL SUPPLY — 26 items
BAG HAMPER (MISCELLANEOUS) ×3 IMPLANT
CHLORAPREP W/TINT 26ML (MISCELLANEOUS) ×3 IMPLANT
CLOTH BEACON ORANGE TIMEOUT ST (SAFETY) ×3 IMPLANT
COVER LIGHT HANDLE STERIS (MISCELLANEOUS) ×6 IMPLANT
DECANTER SPIKE VIAL GLASS SM (MISCELLANEOUS) ×4 IMPLANT
ELECT REM PT RETURN 9FT ADLT (ELECTROSURGICAL) ×3
ELECTRODE REM PT RTRN 9FT ADLT (ELECTROSURGICAL) ×1 IMPLANT
FORMALIN 10 PREFIL 480ML (MISCELLANEOUS) ×2 IMPLANT
GLOVE BIOGEL PI IND STRL 7.5 (GLOVE) IMPLANT
GLOVE BIOGEL PI INDICATOR 7.5 (GLOVE) ×2
GLOVE EXAM NITRILE LRG STRL (GLOVE) ×2 IMPLANT
GLOVE SURG SS PI 7.5 STRL IVOR (GLOVE) ×6 IMPLANT
GOWN STRL REUS W/TWL LRG LVL3 (GOWN DISPOSABLE) ×6 IMPLANT
KIT ROOM TURNOVER APOR (KITS) ×3 IMPLANT
LIQUID BAND (GAUZE/BANDAGES/DRESSINGS) ×2 IMPLANT
MANIFOLD NEPTUNE II (INSTRUMENTS) ×3 IMPLANT
NDL HYPO 25X1 1.5 SAFETY (NEEDLE) ×1 IMPLANT
NEEDLE HYPO 25X1 1.5 SAFETY (NEEDLE) ×3 IMPLANT
NS IRRIG 1000ML POUR BTL (IV SOLUTION) ×3 IMPLANT
PACK MINOR (CUSTOM PROCEDURE TRAY) ×3 IMPLANT
PAD ARMBOARD 7.5X6 YLW CONV (MISCELLANEOUS) ×3 IMPLANT
SET BASIN LINEN APH (SET/KITS/TRAYS/PACK) ×3 IMPLANT
SUT VIC AB 3-0 SH 27 (SUTURE) ×3
SUT VIC AB 3-0 SH 27X BRD (SUTURE) ×1 IMPLANT
SUT VIC AB 4-0 PS2 27 (SUTURE) ×5 IMPLANT
SYR CONTROL 10ML LL (SYRINGE) ×2 IMPLANT

## 2014-09-30 NOTE — Anesthesia Preprocedure Evaluation (Signed)
Anesthesia Evaluation  Patient identified by MRN, date of birth, ID band Patient awake    Reviewed: Allergy & Precautions, NPO status , Patient's Chart, lab work & pertinent test results  Airway Mallampati: I  TM Distance: >3 FB     Dental  (+) Teeth Intact   Pulmonary neg pulmonary ROS,  breath sounds clear to auscultation        Cardiovascular negative cardio ROS  Rhythm:Regular Rate:Normal     Neuro/Psych    GI/Hepatic negative GI ROS,   Endo/Other    Renal/GU      Musculoskeletal   Abdominal   Peds  Hematology   Anesthesia Other Findings   Reproductive/Obstetrics                             Anesthesia Physical Anesthesia Plan  ASA: I  Anesthesia Plan: General   Post-op Pain Management:    Induction: Intravenous  Airway Management Planned: LMA  Additional Equipment:   Intra-op Plan:   Post-operative Plan: Extubation in OR  Informed Consent: I have reviewed the patients History and Physical, chart, labs and discussed the procedure including the risks, benefits and alternatives for the proposed anesthesia with the patient or authorized representative who has indicated his/her understanding and acceptance.     Plan Discussed with:   Anesthesia Plan Comments:         Anesthesia Quick Evaluation

## 2014-09-30 NOTE — Op Note (Signed)
Patient:  Joan Rose  DOB:  1996-01-22  MRN:  [id number]   Preop Diagnosis:  Right breast neoplasms, 2  Postop Diagnosis:  Same  Procedure:  Right breast biopsy  Surgeon:  Aviva Signs, M.D.  Anes:  Gen.   Indications:  Patient is a 19 year old black female who presents with an enlarging neoplasm the right breast. She has a significantly enlarged mass involving the inferior aspect of the right breast as well as a smaller but distinct 2 cm mass in the upper, outer quadrant of the right breast. She now presents for a right breast biopsy of both. The risks and benefits of the procedure including bleeding, infection, and recurrence of the mass were fully explained to the patient, who gave informed consent.  Procedure note:  The patient is placed the supine position. After general anesthesia was administered, the right breast was prepped and draped using the usual sterile technique with ChloraPrep. Surgical site confirmation was performed.  An inframammary incision was made in the right breast. I then tunneled up to the large greater than 6 cm mass along the inferior portion of the right breast. It was excised without difficulty. It was sent to pathology further examination. The smaller 2 cm mass was also removed through this same incision. It was sent to pathology further examination. Any bleeding was controlled using Bovie electrocautery. The skin was infiltrated with 0.5% Sensorcaine. The skin was closed using 4-0 Vicryl subcuticular suture. The liquiband was applied.  All tape and needle counts were correct at the end of the procedure. The patient was awakened and transferred to PACU in stable condition.  Complications:  None  EBL:  Minimal  Specimen:  Neoplasms, right breast

## 2014-09-30 NOTE — Discharge Instructions (Signed)
Breast Biopsy  Care After Refer to this sheet in the next few weeks. These instructions provide you with information on caring for yourself after your procedure. Your caregiver may also give you more specific instructions. Your treatment has been planned according to current medical practices, but problems sometimes occur. Call your caregiver if you have any problems or questions after your procedure. HOME CARE INSTRUCTIONS   Only take over-the-counter or prescription medicines for pain, discomfort, or fever as directed by your caregiver.  Do not take aspirin. It can cause bleeding.  Keep stitches dry when bathing.  Protect the biopsy area. Do not let the area get bumped.  Avoid activities that may pull the incision site open until approved by your caregiver. This can include stretching, reaching, exercise, sports, or lifting over 3 pounds.  Resume your usual diet.  Wear a good support bra for as long as directed by your caregiver.  Change any bandages (dressings) as directed by your caregiver.  Do not drink alcohol while taking pain medicine.  Keep all your follow-up appointments with your caregiver. Ask when your test results will be ready. Make sure you get your test results. SEEK MEDICAL CARE IF:   You have redness, swelling, or increasing pain in the biopsy site.  You have a bad smell coming from the biopsy site or dressing.  Your biopsy site breaks open after the stitches (sutures), staples, or skin adhesive strips have been removed.  You have a rash.  You need stronger medicine. SEEK IMMEDIATE MEDICAL CARE IF:   You have a fever.  You have increased bleeding (more than a small spot) from the biopsy site.  You have difficulty breathing.  You have pus coming from the biopsy site. MAKE SURE YOU:  Understand these instructions.  Will watch your condition.  Will get help right away if you are not doing well or get worse. Document Released: 03/19/2005 Document  Revised: 11/22/2011 Document Reviewed: 09/30/2011 [facility] Patient Information 2015 [place], [place]. This information is not intended to replace advice given to you by your health care provider. Make sure you discuss any questions you have with your health care provider.   PATIENT INSTRUCTIONS POST-ANESTHESIA  IMMEDIATELY FOLLOWING SURGERY:  Do not drive or operate machinery for the first twenty four hours after surgery.  Do not make any important decisions for twenty four hours after surgery or while taking narcotic pain medications or sedatives.  If you develop intractable nausea and vomiting or a severe headache please notify your doctor immediately.  FOLLOW-UP:  Please make an appointment with your surgeon as instructed. You do not need to follow up with anesthesia unless specifically instructed to do so.  WOUND CARE INSTRUCTIONS (if applicable):  Keep a dry clean dressing on the anesthesia/puncture wound site if there is drainage.  Once the wound has quit draining you may leave it open to air.  Generally you should leave the bandage intact for twenty four hours unless there is drainage.  If the epidural site drains for more than 36-48 hours please call the anesthesia department.  QUESTIONS?:  Please feel free to call your physician or the hospital operator if you have any questions, and they will be happy to assist you.

## 2014-09-30 NOTE — Anesthesia Postprocedure Evaluation (Signed)
Anesthesia Post Note  Patient: Joan Rose  Procedure(s) Performed: Procedure(s) (LRB): BREAST BIOPSY (Right)  Anesthesia type: General  Patient location: PACU  Post pain: Pain level controlled  Post assessment: Post-op Vital signs reviewed, Patient's Cardiovascular Status Stable, Respiratory Function Stable, Patent Airway, No signs of Nausea or vomiting and Pain level controlled  Last Vitals:  Filed Vitals:   09/30/14 1137  BP: 109/69  Pulse: 70  Temp:   Resp: 16    Post vital signs: Reviewed and stable  Level of consciousness: awake and alert   Complications: No apparent anesthesia complications

## 2014-09-30 NOTE — Anesthesia Procedure Notes (Signed)
Procedure Name: LMA Insertion Date/Time: 09/30/2014 9:15 AM Performed by: Andree Elk, AMY A Pre-anesthesia Checklist: Patient identified, Timeout performed, Emergency Drugs available, Suction available and Patient being monitored Patient Re-evaluated:Patient Re-evaluated prior to inductionOxygen Delivery Method: Circle system utilized Intubation Type: IV induction Ventilation: Mask ventilation without difficulty LMA Size: 4.0 Number of attempts: 1 Placement Confirmation: positive ETCO2 and breath sounds checked- equal and bilateral Tube secured with: Tape Dental Injury: Teeth and Oropharynx as per pre-operative assessment

## 2014-09-30 NOTE — Interval H&P Note (Signed)
History and Physical Interval Note:  09/30/2014 8:55 AM  Joan Rose  has presented today for surgery, with the diagnosis of right breast mass neoplasm unspecified  The various methods of treatment have been discussed with the patient and family. After consideration of risks, benefits and other options for treatment, the patient has consented to  Procedure(s): BREAST BIOPSY (Right) as a surgical intervention .  The patient's history has been reviewed, patient examined, no change in status, stable for surgery.  I have reviewed the patient's chart and labs.  Questions were answered to the patient's satisfaction.     Aviva Signs A

## 2014-09-30 NOTE — Transfer of Care (Signed)
Immediate Anesthesia Transfer of Care Note  Patient: Joan Rose  Procedure(s) Performed: Procedure(s): BREAST BIOPSY (Right)  Patient Location: PACU  Anesthesia Type:General  Level of Consciousness: sedated and patient cooperative  Airway & Oxygen Therapy: Patient Spontanous Breathing and Patient connected to face mask oxygen  Post-op Assessment: Report given to PACU RN and Post -op Vital signs reviewed and stable  Post vital signs: Reviewed and stable  Complications: No apparent anesthesia complications

## 2014-10-01 ENCOUNTER — Encounter (HOSPITAL_COMMUNITY): Payer: Self-pay | Admitting: General Surgery

## 2015-02-04 ENCOUNTER — Emergency Department (HOSPITAL_COMMUNITY)
Admission: EM | Admit: 2015-02-04 | Discharge: 2015-02-04 | Disposition: A | Discharge status: Home / Self Care | Payer: Medicaid Other | Attending: Emergency Medicine | Admitting: Emergency Medicine

## 2015-02-04 ENCOUNTER — Encounter (HOSPITAL_COMMUNITY): Payer: Self-pay | Admitting: Cardiology

## 2015-02-04 DIAGNOSIS — R5383 Other fatigue: Secondary | ICD-10-CM | POA: Insufficient documentation

## 2015-02-04 DIAGNOSIS — R109 Unspecified abdominal pain: Secondary | ICD-10-CM | POA: Diagnosis present

## 2015-02-04 DIAGNOSIS — R110 Nausea: Secondary | ICD-10-CM | POA: Insufficient documentation

## 2015-02-04 DIAGNOSIS — Z3490 Encounter for supervision of normal pregnancy, unspecified, unspecified trimester: Secondary | ICD-10-CM

## 2015-02-04 DIAGNOSIS — Z331 Pregnant state, incidental: Secondary | ICD-10-CM | POA: Diagnosis not present

## 2015-02-04 LAB — URINALYSIS, ROUTINE W REFLEX MICROSCOPIC
Bilirubin Urine: NEGATIVE
GLUCOSE, UA: NEGATIVE mg/dL
HGB URINE DIPSTICK: NEGATIVE
Leukocytes, UA: NEGATIVE
Nitrite: NEGATIVE
PROTEIN: NEGATIVE mg/dL
Specific Gravity, Urine: 1.025 (ref 1.005–1.030)
Urobilinogen, UA: 0.2 mg/dL (ref 0.0–1.0)
pH: 6.5 (ref 5.0–8.0)

## 2015-02-04 LAB — POC URINE PREG, ED: Preg Test, Ur: POSITIVE — AB

## 2015-02-04 NOTE — ED Provider Notes (Signed)
CSN: [id number]     Arrival date & time 02/04/15  1647 History   First MD Initiated Contact with Patient 02/04/15 1836     Chief Complaint  Patient presents with  . Abdominal Cramping  . Nausea    Patient is a 19 y.o. female presenting with cramps.  Abdominal Cramping This is a new problem. The problem has been resolved. Nothing aggravates the symptoms. Nothing relieves the symptoms.  pt reports for past several days she has nausea in the morning and mild/brief abdominal cramping (none at this time) She denies fever No vag bleeding  PMH -none  Past Surgical History  Procedure Laterality Date  . Breast biopsy Right 09/30/2014    Procedure: BREAST BIOPSY;  Surgeon: Jamesetta So, MD;  Location: [facility];  Service: General;  Laterality: Right;   History reviewed. No pertinent family history. History  Substance Use Topics  . Smoking status: Never Smoker   . Smokeless tobacco: Not on file  . Alcohol Use: No   OB History    No data available     Review of Systems  Constitutional: Positive for fatigue. Negative for fever.  Genitourinary: Negative for dysuria and vaginal bleeding.  All other systems reviewed and are negative.     Allergies  Review of patient's allergies indicates no known allergies.  Home Medications   Prior to Admission medications   Medication Sig Start Date End Date Taking? Authorizing Provider  oxyCODONE-acetaminophen (PERCOCET) 7.5-325 MG per tablet Take 1-2 tablets by mouth every 4 (four) hours as needed. Patient not taking: Reported on 02/04/2015 09/30/14   Aviva Signs Md, MD   BP 125/72 mmHg  Pulse 82  Temp(Src) 99.1 F (37.3 C) (Oral)  Resp 18  Ht 5\' 6"  (1.676 m)  Wt 129 lb 5 oz (58.656 kg)  BMI 20.88 kg/m2  SpO2 100%  LMP 12/27/2014 Physical Exam CONSTITUTIONAL: Well developed/well nourished HEAD: Normocephalic/atraumatic EYES: EOMI/PERRL ENMT: Mucous membranes moist NECK: supple no meningeal signs SPINE/BACK:entire spine  nontender CV: S1/S2 noted, no murmurs/rubs/gallops noted LUNGS: Lungs are clear to auscultation bilaterally, no apparent distress ABDOMEN: soft, nontender, no rebound or guarding, bowel sounds noted throughout abdomen GU:no cva tenderness NEURO: Pt is awake/alert/appropriate, moves all extremitiesx4.  No facial droop.   EXTREMITIES: pulses normal/equal, full ROM SKIN: warm, color normal PSYCH: no abnormalities of mood noted, alert and oriented to situation  ED Course  Procedures  Labs Review Labs Reviewed  URINALYSIS, ROUTINE W REFLEX MICROSCOPIC - Abnormal; Notable for the following:    Ketones, ur TRACE (*)    All other components within normal limits  POC URINE PREG, ED - Abnormal; Notable for the following:    Preg Test, Ur POSITIVE (*)    All other components within normal limits    Pt found to be pregnant She has no abd pain here No vag bleeding Doubt ectopic Likely early IUP Discussed need to start prenatal vitamins Referred to OBGYN  MDM   Final diagnoses:  Pregnancy    Nursing notes including past medical history and social history reviewed and considered in documentation Labs/vital reviewed myself and considered during evaluation     Ripley Fraise, MD 02/04/15 2042

## 2015-02-04 NOTE — ED Notes (Signed)
Nausea and abdominal cramping  times one week.

## 2015-02-11 ENCOUNTER — Encounter: Payer: Medicaid Other | Admitting: Adult Health

## 2015-02-12 ENCOUNTER — Other Ambulatory Visit: Payer: Self-pay | Admitting: Obstetrics & Gynecology

## 2015-02-12 DIAGNOSIS — O3680X0 Pregnancy with inconclusive fetal viability, not applicable or unspecified: Secondary | ICD-10-CM

## 2015-02-18 ENCOUNTER — Ambulatory Visit (INDEPENDENT_AMBULATORY_CARE_PROVIDER_SITE_OTHER): Payer: Medicaid Other

## 2015-02-18 DIAGNOSIS — O3680X0 Pregnancy with inconclusive fetal viability, not applicable or unspecified: Secondary | ICD-10-CM | POA: Diagnosis not present

## 2015-02-18 NOTE — Progress Notes (Signed)
[place] 7+4wks single iup w/ys,simple rt ov cyst 4.7 x 4.8 x 5.4cm,normal lt ov,pos fht 170bpm,crl 16.33mm

## 2015-02-25 ENCOUNTER — Ambulatory Visit (INDEPENDENT_AMBULATORY_CARE_PROVIDER_SITE_OTHER): Payer: Medicaid Other | Admitting: Women's Health

## 2015-02-25 ENCOUNTER — Encounter: Payer: Self-pay | Admitting: Women's Health

## 2015-02-25 VITALS — BP 102/60 | HR 80 | Wt 126.0 lb

## 2015-02-25 DIAGNOSIS — Z331 Pregnant state, incidental: Secondary | ICD-10-CM

## 2015-02-25 DIAGNOSIS — O219 Vomiting of pregnancy, unspecified: Secondary | ICD-10-CM

## 2015-02-25 DIAGNOSIS — Z0283 Encounter for blood-alcohol and blood-drug test: Secondary | ICD-10-CM

## 2015-02-25 DIAGNOSIS — Z3682 Encounter for antenatal screening for nuchal translucency: Secondary | ICD-10-CM

## 2015-02-25 DIAGNOSIS — Z3400 Encounter for supervision of normal first pregnancy, unspecified trimester: Secondary | ICD-10-CM | POA: Insufficient documentation

## 2015-02-25 DIAGNOSIS — Z3401 Encounter for supervision of normal first pregnancy, first trimester: Secondary | ICD-10-CM | POA: Diagnosis not present

## 2015-02-25 DIAGNOSIS — Z369 Encounter for antenatal screening, unspecified: Secondary | ICD-10-CM

## 2015-02-25 DIAGNOSIS — Z1389 Encounter for screening for other disorder: Secondary | ICD-10-CM

## 2015-02-25 LAB — POCT URINALYSIS DIPSTICK
Glucose, UA: NEGATIVE
KETONES UA: NEGATIVE
Leukocytes, UA: NEGATIVE
Nitrite, UA: NEGATIVE
Protein, UA: NEGATIVE
RBC UA: NEGATIVE

## 2015-02-25 MED ORDER — DOXYLAMINE-PYRIDOXINE 10-10 MG PO TBEC: DELAYED_RELEASE_TABLET | ORAL | Status: DC

## 2015-02-25 NOTE — Progress Notes (Signed)
  Subjective:  Joan Rose is a 19 y.o. G76P0 African American female at [redacted]w[redacted]d by LMP c/w 8wk u/s, being seen today for her first obstetrical visit.  Her obstetrical history is significant for primigravida.  Pregnancy history fully reviewed.  Patient reports nausea, vomiting and requests meds. Denies vb, cramping, uti s/s, abnormal/malodorous vag d/c, or vulvovaginal itching/irritation.  BP 102/60 mmHg  Pulse 80  Wt 126 lb (57.153 kg)  LMP 12/27/2014 (Exact Date)  HISTORY: OB History  Gravida Para Term Preterm AB SAB TAB Ectopic Multiple Living  1             # Outcome Date GA Lbr Len/2nd Weight Sex Delivery Anes PTL Lv  1 Current              Past Medical History  Diagnosis Date  . Medical history non-contributory    Past Surgical History  Procedure Laterality Date  . Breast biopsy Right 09/30/2014    Procedure: BREAST BIOPSY;  Surgeon: Jamesetta So, MD;  Location: [facility];  Service: General;  Laterality: Right;   Family History  Problem Relation Age of Onset  . Cancer Maternal Grandmother     throat    Exam   System:     General: Well developed & nourished, no acute distress   Skin: Warm & dry, normal coloration and turgor, no rashes   Neurologic: Alert & oriented, normal mood   Cardiovascular: Regular rate & rhythm   Respiratory: Effort & rate normal, LCTAB, acyanotic   Abdomen: Soft, non tender   Extremities: normal strength, tone   Assessment:   Pregnancy: G1P0 Patient Active Problem List   Diagnosis Date Noted  . Irregular menses 06/07/2013  . Unprotected sex 06/07/2013  . BMI (body mass index), pediatric, 5% to less than 85% for age 09/06/2013  . Well child check 06/07/2013    [redacted]w[redacted]d G1P0 New OB visit   Plan:  Initial labs drawn Continue prenatal vitamins Problem list reviewed and updated Reviewed n/v relief measures and warning s/s to report Rx diclegis, prior auth approved through [employer] Reviewed recommended weight gain based on pre-gravid  BMI Encouraged well-balanced diet Genetic Screening discussed Integrated Screen: requested Cystic fibrosis screening discussed requested Ultrasound discussed; fetal survey: requested Follow up in 4 weeks for visit and 1st it/nt [place] completed [employer] offered, accepted, referral faxed   Tawnya Crook CNM, [facility] 02/25/2015 2:49 PM

## 2015-02-25 NOTE — Patient Instructions (Addendum)

## 2015-02-27 LAB — GC/CHLAMYDIA PROBE AMP
CHLAMYDIA, DNA PROBE: POSITIVE — AB
Neisseria gonorrhoeae by PCR: NEGATIVE

## 2015-02-27 LAB — URINE CULTURE

## 2015-03-03 ENCOUNTER — Telehealth: Payer: Self-pay | Admitting: Women's Health

## 2015-03-03 DIAGNOSIS — O98811 Other maternal infectious and parasitic diseases complicating pregnancy, first trimester: Principal | ICD-10-CM

## 2015-03-03 DIAGNOSIS — A749 Chlamydial infection, unspecified: Secondary | ICD-10-CM | POA: Insufficient documentation

## 2015-03-03 LAB — VARICELLA ZOSTER ANTIBODY, IGG: VARICELLA: 2428 {index} (ref >165)

## 2015-03-03 LAB — URINALYSIS, ROUTINE W REFLEX MICROSCOPIC
Bilirubin, UA: NEGATIVE
Glucose, UA: NEGATIVE
Ketones, UA: NEGATIVE
Leukocytes, UA: NEGATIVE
NITRITE UA: NEGATIVE
RBC, UA: NEGATIVE
Specific Gravity, UA: 1.024 (ref 1.005–1.030)
Urobilinogen, Ur: 1 mg/dL (ref 0.2–1.0)
pH, UA: 7 (ref 5.0–7.5)

## 2015-03-03 LAB — CYSTIC FIBROSIS MUTATION 97: Interpretation: NOT DETECTED

## 2015-03-03 LAB — CBC
Hematocrit: 32.8 % — ABNORMAL LOW (ref 34.0–46.6)
Hemoglobin: 11 g/dL — ABNORMAL LOW (ref 11.1–15.9)
MCH: 27 pg (ref 26.6–33.0)
MCHC: 33.5 g/dL (ref 31.5–35.7)
MCV: 80 fL (ref 79–97)
PLATELETS: 286 10*3/uL (ref 150–379)
RBC: 4.08 x10E6/uL (ref 3.77–5.28)
RDW: 13.7 % (ref 12.3–15.4)
WBC: 9.7 10*3/uL (ref 3.4–10.8)

## 2015-03-03 LAB — RPR: RPR Ser Ql: NONREACTIVE

## 2015-03-03 LAB — PMP SCREEN PROFILE (10S), URINE
Amphetamine Screen, Ur: NEGATIVE ng/mL
BARBITURATE SCRN UR: NEGATIVE ng/mL
BENZODIAZEPINE SCREEN, URINE: NEGATIVE ng/mL
Cannabinoids Ur Ql Scn: NEGATIVE ng/mL
Cocaine(Metab.)Screen, Urine: NEGATIVE ng/mL
Creatinine(Crt), U: 187.6 mg/dL (ref 20.0–300.0)
Methadone Scn, Ur: NEGATIVE ng/mL
Opiate Scrn, Ur: NEGATIVE ng/mL
Oxycodone+Oxymorphone Ur Ql Scn: NEGATIVE ng/mL
PCP Scrn, Ur: NEGATIVE ng/mL
PROPOXYPHENE SCREEN: NEGATIVE ng/mL
Ph of Urine: 6.8 (ref 4.5–8.9)

## 2015-03-03 LAB — ANTIBODY SCREEN: ANTIBODY SCREEN: NEGATIVE

## 2015-03-03 LAB — RUBELLA SCREEN: Rubella Antibodies, IGG: 2.35 index (ref >0.99)

## 2015-03-03 LAB — ABO/RH: Rh Factor: POSITIVE

## 2015-03-03 LAB — HEPATITIS B SURFACE ANTIGEN: Hepatitis B Surface Ag: NEGATIVE

## 2015-03-03 LAB — SICKLE CELL SCREEN: Sickle Cell Screen: NEGATIVE

## 2015-03-03 LAB — HIV ANTIBODY (ROUTINE TESTING W REFLEX): HIV SCREEN 4TH GENERATION: NONREACTIVE

## 2015-03-03 MED ORDER — AZITHROMYCIN 500 MG PO TABS: 1000.0000 mg | ORAL_TABLET | Freq: Once | ORAL | Status: DC

## 2015-03-03 NOTE — Telephone Encounter (Signed)
Pt called back w/ partner's info. Rx for azithromycin 1gm po x 1 called into [facility] [place] for [employer], DOB [birth year], nkda.  Roma Schanz, CNM, [facility] 03/03/2015 1:56 PM

## 2015-03-03 NOTE — Telephone Encounter (Signed)
Pt states her partner does want [place] to treat him as well.  Joan Rose DOB 07/20/1993 NKDA, please call in to [facility].

## 2015-03-03 NOTE — Telephone Encounter (Signed)
Notified pt of +CT, rx azithromycin 1gm po x 1 sent to pharmacy. Pt will talk to partner and call back if she wants me to tx, otherwise he is to go to pcp or hd. No sex x 7d from both taking med, poc for pt in 3-4wks.  Roma Schanz, CNM, [facility] 03/03/2015 9:51 AM

## 2015-03-25 ENCOUNTER — Other Ambulatory Visit: Payer: Medicaid Other

## 2015-03-25 ENCOUNTER — Ambulatory Visit (INDEPENDENT_AMBULATORY_CARE_PROVIDER_SITE_OTHER): Payer: Medicaid Other

## 2015-03-25 ENCOUNTER — Ambulatory Visit (INDEPENDENT_AMBULATORY_CARE_PROVIDER_SITE_OTHER): Payer: Medicaid Other | Admitting: Women's Health

## 2015-03-25 ENCOUNTER — Encounter: Payer: Medicaid Other | Admitting: Women's Health

## 2015-03-25 VITALS — BP 114/66 | HR 93 | Wt 129.0 lb

## 2015-03-25 DIAGNOSIS — O98819 Other maternal infectious and parasitic diseases complicating pregnancy, unspecified trimester: Secondary | ICD-10-CM

## 2015-03-25 DIAGNOSIS — Z3401 Encounter for supervision of normal first pregnancy, first trimester: Secondary | ICD-10-CM

## 2015-03-25 DIAGNOSIS — Z331 Pregnant state, incidental: Secondary | ICD-10-CM

## 2015-03-25 DIAGNOSIS — N83202 Unspecified ovarian cyst, left side: Secondary | ICD-10-CM | POA: Insufficient documentation

## 2015-03-25 DIAGNOSIS — O98319 Other infections with a predominantly sexual mode of transmission complicating pregnancy, unspecified trimester: Secondary | ICD-10-CM

## 2015-03-25 DIAGNOSIS — A749 Chlamydial infection, unspecified: Secondary | ICD-10-CM

## 2015-03-25 DIAGNOSIS — Z3682 Encounter for antenatal screening for nuchal translucency: Secondary | ICD-10-CM

## 2015-03-25 DIAGNOSIS — Z36 Encounter for antenatal screening of mother: Secondary | ICD-10-CM | POA: Diagnosis not present

## 2015-03-25 DIAGNOSIS — N8320 Unspecified ovarian cysts: Secondary | ICD-10-CM

## 2015-03-25 DIAGNOSIS — Z1389 Encounter for screening for other disorder: Secondary | ICD-10-CM

## 2015-03-25 NOTE — Patient Instructions (Signed)
[place] gummies  Second Trimester of Pregnancy The second trimester is from week 13 through week 28, months 4 through 6. The second trimester is often a time when you feel your best. Your body has also adjusted to being pregnant, and you begin to feel better physically. Usually, morning sickness has lessened or quit completely, you may have more energy, and you may have an increase in appetite. The second trimester is also a time when the fetus is growing rapidly. At the end of the sixth month, the fetus is about 9 inches long and weighs about 1 pounds. You will likely begin to feel the baby move (quickening) between 18 and 20 weeks of the pregnancy. BODY CHANGES Your body goes through many changes during pregnancy. The changes vary from woman to woman.   Your weight will continue to increase. You will notice your lower abdomen bulging out.  You may begin to get stretch marks on your hips, abdomen, and breasts.  You may develop headaches that can be relieved by medicines approved by your health care provider.  You may urinate more often because the fetus is pressing on your bladder.  You may develop or continue to have heartburn as a result of your pregnancy.  You may develop constipation because certain hormones are causing the muscles that push waste through your intestines to slow down.  You may develop hemorrhoids or swollen, bulging veins (varicose veins).  You may have back pain because of the weight gain and pregnancy hormones relaxing your joints between the bones in your pelvis and as a result of a shift in weight and the muscles that support your balance.  Your breasts will continue to grow and be tender.  Your gums may bleed and may be sensitive to brushing and flossing.  Dark spots or blotches (chloasma, mask of pregnancy) may develop on your face. This will likely fade after the baby is born.  A dark line from your belly button to the pubic area (linea nigra) may appear.  This will likely fade after the baby is born.  You may have changes in your hair. These can include thickening of your hair, rapid growth, and changes in texture. Some women also have hair loss during or after pregnancy, or hair that feels dry or thin. Your hair will most likely return to normal after your baby is born. WHAT TO EXPECT AT YOUR PRENATAL VISITS During a routine prenatal visit:  You will be weighed to make sure you and the fetus are growing normally.  Your blood pressure will be taken.  Your abdomen will be measured to track your baby's growth.  The fetal heartbeat will be listened to.  Any test results from the previous visit will be discussed. Your health care provider may ask you:  How you are feeling.  If you are feeling the baby move.  If you have had any abnormal symptoms, such as leaking fluid, bleeding, severe headaches, or abdominal cramping.  If you have any questions. Other tests that may be performed during your second trimester include:  Blood tests that check for:  Low iron levels (anemia).  Gestational diabetes (between 24 and 28 weeks).  Rh antibodies.  Urine tests to check for infections, diabetes, or protein in the urine.  An ultrasound to confirm the proper growth and development of the baby.  An amniocentesis to check for possible genetic problems.  Fetal screens for spina bifida and Down syndrome. HOME CARE INSTRUCTIONS   Avoid all smoking, herbs,  alcohol, and unprescribed drugs. These chemicals affect the formation and growth of the baby.  Follow your health care provider's instructions regarding medicine use. There are medicines that are either safe or unsafe to take during pregnancy.  Exercise only as directed by your health care provider. Experiencing uterine cramps is a good sign to stop exercising.  Continue to eat regular, healthy meals.  Wear a good support bra for breast tenderness.  Do not use hot tubs, steam rooms, or  saunas.  Wear your seat belt at all times when driving.  Avoid raw meat, uncooked cheese, cat litter boxes, and soil used by cats. These carry germs that can cause birth defects in the baby.  Take your prenatal vitamins.  Try taking a stool softener (if your health care provider approves) if you develop constipation. Eat more high-fiber foods, such as fresh vegetables or fruit and whole grains. Drink plenty of fluids to keep your urine clear or pale yellow.  Take warm sitz baths to soothe any pain or discomfort caused by hemorrhoids. Use hemorrhoid cream if your health care provider approves.  If you develop varicose veins, wear support hose. Elevate your feet for 15 minutes, 3-4 times a day. Limit salt in your diet.  Avoid heavy lifting, wear low heel shoes, and practice good posture.  Rest with your legs elevated if you have leg cramps or low back pain.  Visit your dentist if you have not gone yet during your pregnancy. Use a soft toothbrush to brush your teeth and be gentle when you floss.  A sexual relationship may be continued unless your health care provider directs you otherwise.  Continue to go to all your prenatal visits as directed by your health care provider. SEEK MEDICAL CARE IF:   You have dizziness.  You have mild pelvic cramps, pelvic pressure, or nagging pain in the abdominal area.  You have persistent nausea, vomiting, or diarrhea.  You have a bad smelling vaginal discharge.  You have pain with urination. SEEK IMMEDIATE MEDICAL CARE IF:   You have a fever.  You are leaking fluid from your vagina.  You have spotting or bleeding from your vagina.  You have severe abdominal cramping or pain.  You have rapid weight gain or loss.  You have shortness of breath with chest pain.  You notice sudden or extreme swelling of your face, hands, ankles, feet, or legs.  You have not felt your baby move in over an hour.  You have severe headaches that do not go  away with medicine.  You have vision changes. Document Released: 08/24/2001 Document Revised: 09/04/2013 Document Reviewed: 10/31/2012 [facility] Patient Information 2015 [place], [place]. This information is not intended to replace advice given to you by your health care provider. Make sure you discuss any questions you have with your health care provider.

## 2015-03-25 NOTE — Progress Notes (Signed)
[place] 12+4wks measurements c/w dates,pos fht 156 bpm,ant pl,NB present,NT 160mm,crl 65.14mm,normal rt ov, lt ov simple cyst 5.7 x 5.6 x 5.3cm

## 2015-03-25 NOTE — Progress Notes (Signed)
Low-risk OB appointment G1P0 [redacted]w[redacted]d Estimated Date of Delivery: 10/03/15 BP 114/66 mmHg  Pulse 93  Wt 129 lb (58.514 kg)  LMP 12/27/2014 (Exact Date)  BP, weight, and urine reviewed.  Refer to obstetrical flow sheet for FH & FHR.  No fm yet. Denies cramping, lof, vb, or uti s/s. No complaints. Prenatal gummies making her sick, can try flinstone gummies. She is unable to swallow pills.  Reviewed today's normal nt u/s, warning s/s to report. 5cm Lt simple ovarian cyst on today's u/s- denies any pain/problems.  Plan:  Continue routine obstetrical care  F/U in 4wks for OB appointment and 2nd IT 1st IT/NT today, CT POC today

## 2015-03-27 LAB — GC/CHLAMYDIA PROBE AMP
Chlamydia trachomatis, NAA: NEGATIVE
NEISSERIA GONORRHOEAE BY PCR: NEGATIVE

## 2015-04-02 ENCOUNTER — Telehealth: Payer: Self-pay | Admitting: Women's Health

## 2015-04-02 NOTE — Telephone Encounter (Signed)
Pt was told she has refills and was informed to call her pharmacy to get medication refilled.  She states she did call [facility] and they told her she needed to call us because they could not process the RX.

## 2015-04-02 NOTE — Telephone Encounter (Signed)
Left message on pts vm that there was a mix up with the prior auth and it was only approved for 30 day supply.  Joan Rose has resubmitted the prior auth and should go through in the next 24 - 48 hours.

## 2015-04-03 LAB — MATERNAL SCREEN, INTEGRATED #1
CROWN RUMP LENGTH MAT SCREEN: 65.9 mm
GEST. AGE ON COLLECTION DATE: 12.7 wk
MATERNAL AGE AT EDD: 21 a
NUCHAL TRANSLUCENCY (NT): 1.3 mm
NUMBER OF FETUSES: 1
PAPP-A VALUE: 1068.2 ng/mL
WEIGHT: 126 [lb_av]

## 2015-04-22 ENCOUNTER — Encounter: Payer: Self-pay | Admitting: Women's Health

## 2015-04-22 ENCOUNTER — Ambulatory Visit (INDEPENDENT_AMBULATORY_CARE_PROVIDER_SITE_OTHER): Payer: Medicaid Other | Admitting: Women's Health

## 2015-04-22 VITALS — BP 102/58 | HR 88 | Wt 135.0 lb

## 2015-04-22 DIAGNOSIS — Z1389 Encounter for screening for other disorder: Secondary | ICD-10-CM

## 2015-04-22 DIAGNOSIS — Z331 Pregnant state, incidental: Secondary | ICD-10-CM

## 2015-04-22 DIAGNOSIS — Z3402 Encounter for supervision of normal first pregnancy, second trimester: Secondary | ICD-10-CM

## 2015-04-22 DIAGNOSIS — Z363 Encounter for antenatal screening for malformations: Secondary | ICD-10-CM

## 2015-04-22 DIAGNOSIS — Z3682 Encounter for antenatal screening for nuchal translucency: Secondary | ICD-10-CM

## 2015-04-22 NOTE — Progress Notes (Signed)
Low-risk OB appointment G1P0 [redacted]w[redacted]d Estimated Date of Delivery: 10/03/15 BP 102/58 mmHg  Pulse 88  Wt 135 lb (61.236 kg)  LMP 12/27/2014 (Exact Date)  BP, weight, and urine reviewed.  Refer to obstetrical flow sheet for FH & FHR.  No fm yet. Denies cramping, lof, vb, or uti s/s. No complaints. Reviewed warning s/s to report. Plan:  Continue routine obstetrical care  F/U in 4wks for OB appointment and anatomy u/s 2nd IT today

## 2015-04-22 NOTE — Patient Instructions (Signed)
Second Trimester of Pregnancy The second trimester is from week 13 through week 28, months 4 through 6. The second trimester is often a time when you feel your best. Your body has also adjusted to being pregnant, and you begin to feel better physically. Usually, morning sickness has lessened or quit completely, you may have more energy, and you may have an increase in appetite. The second trimester is also a time when the fetus is growing rapidly. At the end of the sixth month, the fetus is about 9 inches long and weighs about 1 pounds. You will likely begin to feel the baby move (quickening) between 18 and 20 weeks of the pregnancy. BODY CHANGES Your body goes through many changes during pregnancy. The changes vary from woman to woman.   Your weight will continue to increase. You will notice your lower abdomen bulging out.  You may begin to get stretch marks on your hips, abdomen, and breasts.  You may develop headaches that can be relieved by medicines approved by your health care provider.  You may urinate more often because the fetus is pressing on your bladder.  You may develop or continue to have heartburn as a result of your pregnancy.  You may develop constipation because certain hormones are causing the muscles that push waste through your intestines to slow down.  You may develop hemorrhoids or swollen, bulging veins (varicose veins).  You may have back pain because of the weight gain and pregnancy hormones relaxing your joints between the bones in your pelvis and as a result of a shift in weight and the muscles that support your balance.  Your breasts will continue to grow and be tender.  Your gums may bleed and may be sensitive to brushing and flossing.  Dark spots or blotches (chloasma, mask of pregnancy) may develop on your face. This will likely fade after the baby is born.  A dark line from your belly button to the pubic area (linea nigra) may appear. This will likely fade  after the baby is born.  You may have changes in your hair. These can include thickening of your hair, rapid growth, and changes in texture. Some women also have hair loss during or after pregnancy, or hair that feels dry or thin. Your hair will most likely return to normal after your baby is born. WHAT TO EXPECT AT YOUR PRENATAL VISITS During a routine prenatal visit:  You will be weighed to make sure you and the fetus are growing normally.  Your blood pressure will be taken.  Your abdomen will be measured to track your baby's growth.  The fetal heartbeat will be listened to.  Any test results from the previous visit will be discussed. Your health care provider may ask you:  How you are feeling.  If you are feeling the baby move.  If you have had any abnormal symptoms, such as leaking fluid, bleeding, severe headaches, or abdominal cramping.  If you have any questions. Other tests that may be performed during your second trimester include:  Blood tests that check for:  Low iron levels (anemia).  Gestational diabetes (between 24 and 28 weeks).  Rh antibodies.  Urine tests to check for infections, diabetes, or protein in the urine.  An ultrasound to confirm the proper growth and development of the baby.  An amniocentesis to check for possible genetic problems.  Fetal screens for spina bifida and Down syndrome. HOME CARE INSTRUCTIONS   Avoid all smoking, herbs, alcohol, and unprescribed   drugs. These chemicals affect the formation and growth of the baby.  Follow your health care provider's instructions regarding medicine use. There are medicines that are either safe or unsafe to take during pregnancy.  Exercise only as directed by your health care provider. Experiencing uterine cramps is a good sign to stop exercising.  Continue to eat regular, healthy meals.  Wear a good support bra for breast tenderness.  Do not use hot tubs, steam rooms, or saunas.  Wear your  seat belt at all times when driving.  Avoid raw meat, uncooked cheese, cat litter boxes, and soil used by cats. These carry germs that can cause birth defects in the baby.  Take your prenatal vitamins.  Try taking a stool softener (if your health care provider approves) if you develop constipation. Eat more high-fiber foods, such as fresh vegetables or fruit and whole grains. Drink plenty of fluids to keep your urine clear or pale yellow.  Take warm sitz baths to soothe any pain or discomfort caused by hemorrhoids. Use hemorrhoid cream if your health care provider approves.  If you develop varicose veins, wear support hose. Elevate your feet for 15 minutes, 3-4 times a day. Limit salt in your diet.  Avoid heavy lifting, wear low heel shoes, and practice good posture.  Rest with your legs elevated if you have leg cramps or low back pain.  Visit your dentist if you have not gone yet during your pregnancy. Use a soft toothbrush to brush your teeth and be gentle when you floss.  A sexual relationship may be continued unless your health care provider directs you otherwise.  Continue to go to all your prenatal visits as directed by your health care provider. SEEK MEDICAL CARE IF:   You have dizziness.  You have mild pelvic cramps, pelvic pressure, or nagging pain in the abdominal area.  You have persistent nausea, vomiting, or diarrhea.  You have a bad smelling vaginal discharge.  You have pain with urination. SEEK IMMEDIATE MEDICAL CARE IF:   You have a fever.  You are leaking fluid from your vagina.  You have spotting or bleeding from your vagina.  You have severe abdominal cramping or pain.  You have rapid weight gain or loss.  You have shortness of breath with chest pain.  You notice sudden or extreme swelling of your face, hands, ankles, feet, or legs.  You have not felt your baby move in over an hour.  You have severe headaches that do not go away with  medicine.  You have vision changes. Document Released: 08/24/2001 Document Revised: 09/04/2013 Document Reviewed: 10/31/2012 [employer] Patient Information 2015 [employer]. This information is not intended to replace advice given to you by your health care provider. Make sure you discuss any questions you have with your health care provider.  

## 2015-04-24 LAB — MATERNAL SCREEN, INTEGRATED #2
AFP MOM: 0.97
Alpha-Fetoprotein: 37.9 ng/mL
CROWN RUMP LENGTH: 65.9 mm
DIA MoM: 2.15
DIA Value: 395 pg/mL
ESTRIOL UNCONJUGATED: 0.61 ng/mL
GEST. AGE ON COLLECTION DATE: 12.7 wk
GESTATIONAL AGE: 16.7 wk
Maternal Age at EDD: 21 years
NUCHAL TRANSLUCENCY (NT): 1.3 mm
Nuchal Translucency MoM: 0.9
Number of Fetuses: 1
PAPP-A MoM: 0.82
PAPP-A Value: 1068.2 ng/mL
Test Results:: NEGATIVE
Weight: 126 [lb_av]
Weight: 135 [lb_av]
hCG MoM: 2.13
hCG Value: 68.5 IU/mL
uE3 MoM: 0.6

## 2015-05-20 ENCOUNTER — Encounter: Payer: Self-pay | Admitting: Women's Health

## 2015-05-20 ENCOUNTER — Ambulatory Visit (INDEPENDENT_AMBULATORY_CARE_PROVIDER_SITE_OTHER): Payer: Medicaid Other | Admitting: Women's Health

## 2015-05-20 ENCOUNTER — Ambulatory Visit (INDEPENDENT_AMBULATORY_CARE_PROVIDER_SITE_OTHER): Payer: Medicaid Other

## 2015-05-20 VITALS — BP 112/58 | HR 72 | Wt 144.0 lb

## 2015-05-20 DIAGNOSIS — Z331 Pregnant state, incidental: Secondary | ICD-10-CM

## 2015-05-20 DIAGNOSIS — Z3402 Encounter for supervision of normal first pregnancy, second trimester: Secondary | ICD-10-CM

## 2015-05-20 DIAGNOSIS — Z1389 Encounter for screening for other disorder: Secondary | ICD-10-CM

## 2015-05-20 DIAGNOSIS — Z36 Encounter for antenatal screening of mother: Secondary | ICD-10-CM

## 2015-05-20 DIAGNOSIS — Z363 Encounter for antenatal screening for malformations: Secondary | ICD-10-CM

## 2015-05-20 LAB — POCT URINALYSIS DIPSTICK
GLUCOSE UA: NEGATIVE
GLUCOSE UA: NEGATIVE
Glucose, UA: NEGATIVE
Ketones, UA: NEGATIVE
LEUKOCYTES UA: NEGATIVE
NITRITE UA: NEGATIVE
Protein, UA: NEGATIVE
Protein, UA: NEGATIVE
Protein, UA: NEGATIVE
RBC UA: NEGATIVE

## 2015-05-20 NOTE — Progress Notes (Signed)
[place] 20+4wks,breech,measurements c/w dates,efw 384g,svp of fluid 5.9cm,normal rt ov,lt ov simple cl cyst 3x 2.6 x 2.7cm,LV EICF 35mm, cx 3cm,ant pl gr 0,anatomy complete

## 2015-05-20 NOTE — Progress Notes (Signed)
Low-risk OB appointment G1P0 [redacted]w[redacted]d Estimated Date of Delivery: 10/03/15 BP 112/58 mmHg  Pulse 72  Wt 144 lb (65.318 kg)  LMP 12/27/2014 (Exact Date)  BP, weight, and urine reviewed.  Refer to obstetrical flow sheet for FH & FHR.  Reports good fm.  Denies regular uc's, lof, vb, or uti s/s. No complaints. Reviewed today's anatomy u/s, normal except Lt EICF- will recheck @ 28wks, ovarian cyst has decreased in size. Discussed ptl s/s, fm.  Plan:  Continue routine obstetrical care  F/U in 4wks for OB appointment

## 2015-05-20 NOTE — Patient Instructions (Signed)
[place] Pediatricians/Family Doctors:  [facility] [phone]                 [place] [phone] (usually not accepting new patients unless you have family there already, you are always welcome to call and ask)            [facility] ([place]) [phone]   [facility] Pediatricians/Family Doctors:   [place]: [phone]  [facility]: [phone]   Second Trimester of Pregnancy The second trimester is from week 13 through week 28, months 4 through 6. The second trimester is often a time when you feel your best. Your body has also adjusted to being pregnant, and you begin to feel better physically. Usually, morning sickness has lessened or quit completely, you may have more energy, and you may have an increase in appetite. The second trimester is also a time when the fetus is growing rapidly. At the end of the sixth month, the fetus is about 9 inches long and weighs about 1 pounds. You will likely begin to feel the baby move (quickening) between 18 and 20 weeks of the pregnancy. BODY CHANGES Your body goes through many changes during pregnancy. The changes vary from woman to woman.   Your weight will continue to increase. You will notice your lower abdomen bulging out.  You may begin to get stretch marks on your hips, abdomen, and breasts.  You may develop headaches that can be relieved by medicines approved by your health care provider.  You may urinate more often because the fetus is pressing on your bladder.  You may develop or continue to have heartburn as a result of your pregnancy.  You may develop constipation because certain hormones are causing the muscles that push waste through your intestines to slow down.  You may develop hemorrhoids or swollen, bulging veins (varicose veins).  You may have back pain because of the weight  gain and pregnancy hormones relaxing your joints between the bones in your pelvis and as a result of a shift in weight and the muscles that support your balance.  Your breasts will continue to grow and be tender.  Your gums may bleed and may be sensitive to brushing and flossing.  Dark spots or blotches (chloasma, mask of pregnancy) may develop on your face. This will likely fade after the baby is born.  A dark line from your belly button to the pubic area (linea nigra) may appear. This will likely fade after the baby is born.  You may have changes in your hair. These can include thickening of your hair, rapid growth, and changes in texture. Some women also have hair loss during or after pregnancy, or hair that feels dry or thin. Your hair will most likely return to normal after your baby is born. WHAT TO EXPECT AT YOUR PRENATAL VISITS During a routine prenatal visit:  You will be weighed to make sure you and the fetus are growing normally.  Your blood pressure will be taken.  Your abdomen will be measured to track your baby's growth.  The fetal heartbeat will be listened to.  Any test results from the previous visit will be discussed. Your health care provider may ask you:  How you are feeling.  If you are feeling the baby move.  If you have had any abnormal symptoms, such as leaking fluid, bleeding, severe  headaches, or abdominal cramping.  If you have any questions. Other tests that may be performed during your second trimester include:  Blood tests that check for:  Low iron levels (anemia).  Gestational diabetes (between 24 and 28 weeks).  Rh antibodies.  Urine tests to check for infections, diabetes, or protein in the urine.  An ultrasound to confirm the proper growth and development of the baby.  An amniocentesis to check for possible genetic problems.  Fetal screens for spina bifida and Down syndrome. HOME CARE INSTRUCTIONS   Avoid all smoking, herbs,  alcohol, and unprescribed drugs. These chemicals affect the formation and growth of the baby.  Follow your health care provider's instructions regarding medicine use. There are medicines that are either safe or unsafe to take during pregnancy.  Exercise only as directed by your health care provider. Experiencing uterine cramps is a good sign to stop exercising.  Continue to eat regular, healthy meals.  Wear a good support bra for breast tenderness.  Do not use hot tubs, steam rooms, or saunas.  Wear your seat belt at all times when driving.  Avoid raw meat, uncooked cheese, cat litter boxes, and soil used by cats. These carry germs that can cause birth defects in the baby.  Take your prenatal vitamins.  Try taking a stool softener (if your health care provider approves) if you develop constipation. Eat more high-fiber foods, such as fresh vegetables or fruit and whole grains. Drink plenty of fluids to keep your urine clear or pale yellow.  Take warm sitz baths to soothe any pain or discomfort caused by hemorrhoids. Use hemorrhoid cream if your health care provider approves.  If you develop varicose veins, wear support hose. Elevate your feet for 15 minutes, 3-4 times a day. Limit salt in your diet.  Avoid heavy lifting, wear low heel shoes, and practice good posture.  Rest with your legs elevated if you have leg cramps or low back pain.  Visit your dentist if you have not gone yet during your pregnancy. Use a soft toothbrush to brush your teeth and be gentle when you floss.  A sexual relationship may be continued unless your health care provider directs you otherwise.  Continue to go to all your prenatal visits as directed by your health care provider. SEEK MEDICAL CARE IF:   You have dizziness.  You have mild pelvic cramps, pelvic pressure, or nagging pain in the abdominal area.  You have persistent nausea, vomiting, or diarrhea.  You have a bad smelling vaginal  discharge.  You have pain with urination. SEEK IMMEDIATE MEDICAL CARE IF:   You have a fever.  You are leaking fluid from your vagina.  You have spotting or bleeding from your vagina.  You have severe abdominal cramping or pain.  You have rapid weight gain or loss.  You have shortness of breath with chest pain.  You notice sudden or extreme swelling of your face, hands, ankles, feet, or legs.  You have not felt your baby move in over an hour.  You have severe headaches that do not go away with medicine.  You have vision changes. Document Released: 08/24/2001 Document Revised: 09/04/2013 Document Reviewed: 10/31/2012 [employer] Patient Information 2015 [employer]. This information is not intended to replace advice given to you by your health care provider. Make sure you discuss any questions you have with your health care provider.  

## 2015-06-17 ENCOUNTER — Ambulatory Visit (INDEPENDENT_AMBULATORY_CARE_PROVIDER_SITE_OTHER): Payer: Medicaid Other | Admitting: Women's Health

## 2015-06-17 ENCOUNTER — Encounter: Payer: Self-pay | Admitting: Women's Health

## 2015-06-17 ENCOUNTER — Ambulatory Visit (INDEPENDENT_AMBULATORY_CARE_PROVIDER_SITE_OTHER): Payer: Medicaid Other

## 2015-06-17 VITALS — BP 104/60 | HR 68 | Wt 155.0 lb

## 2015-06-17 DIAGNOSIS — O26842 Uterine size-date discrepancy, second trimester: Secondary | ICD-10-CM | POA: Diagnosis not present

## 2015-06-17 DIAGNOSIS — N83202 Unspecified ovarian cyst, left side: Secondary | ICD-10-CM | POA: Diagnosis not present

## 2015-06-17 DIAGNOSIS — O283 Abnormal ultrasonic finding on antenatal screening of mother: Secondary | ICD-10-CM

## 2015-06-17 DIAGNOSIS — Z3402 Encounter for supervision of normal first pregnancy, second trimester: Secondary | ICD-10-CM

## 2015-06-17 DIAGNOSIS — Z331 Pregnant state, incidental: Secondary | ICD-10-CM

## 2015-06-17 DIAGNOSIS — Z1389 Encounter for screening for other disorder: Secondary | ICD-10-CM

## 2015-06-17 LAB — POCT URINALYSIS DIPSTICK
Ketones, UA: NEGATIVE
Leukocytes, UA: NEGATIVE
NITRITE UA: NEGATIVE
PROTEIN UA: NEGATIVE
RBC UA: NEGATIVE

## 2015-06-17 NOTE — Patient Instructions (Addendum)
You will have your sugar test next visit.  Please do not eat or drink anything after midnight the night before you come, not even water.  You will be here for at least two hours.     Call the office [phone]) or go to [facility] if:  You begin to have strong, frequent contractions  Your water breaks.  Sometimes it is a big gush of fluid, sometimes it is just a trickle that keeps getting your panties wet or running down your legs  You have vaginal bleeding.  It is normal to have a small amount of spotting if your cervix was checked.   You don't feel your baby moving like normal.  If you don't, get you something to eat and drink and lay down and focus on feeling your baby move.   If your baby is still not moving like normal, you should call the office or go to [place] have a viral infection that will resolve on its own over time.  Symptoms typically last 3-7 days but can stretch out to 2-3 weeks.  Unfortunately, antibiotics are not helpful for viral infections.  Humidifier and saline nasal spray for nasal congestion  Regular robitussin, cough drops for cough  Warm salt water gargles for sore throat  Mucinex with lots of water to help you cough up the mucous in your chest if needed  Drink plenty of fluids and stay hydrated!  Wash your hands frequently.  Call if you are not improving by 7-10 days.    Second Trimester of Pregnancy The second trimester is from week 13 through week 28, months 4 through 6. The second trimester is often a time when you feel your best. Your body has also adjusted to being pregnant, and you begin to feel better physically. Usually, morning sickness has lessened or quit completely, you may have more energy, and you may have an increase in appetite. The second trimester is also a time when the fetus is growing rapidly. At the end of the sixth month, the fetus is about 9 inches long and weighs about 1 pounds. You will likely begin to feel the  baby move (quickening) between 18 and 20 weeks of the pregnancy. BODY CHANGES Your body goes through many changes during pregnancy. The changes vary from woman to woman.  12. Your weight will continue to increase. You will notice your lower abdomen bulging out. 13. You may begin to get stretch marks on your hips, abdomen, and breasts. 14. You may develop headaches that can be relieved by medicines approved by your health care provider. 15. You may urinate more often because the fetus is pressing on your bladder. 16. You may develop or continue to have heartburn as a result of your pregnancy. 17. You may develop constipation because certain hormones are causing the muscles that push waste through your intestines to slow down. 18. You may develop hemorrhoids or swollen, bulging veins (varicose veins). 19. You may have back pain because of the weight gain and pregnancy hormones relaxing your joints between the bones in your pelvis and as a result of a shift in weight and the muscles that support your balance. 20. Your breasts will continue to grow and be tender. 21. Your gums may bleed and may be sensitive to brushing and flossing. 22. Dark spots or blotches (chloasma, mask of pregnancy) may develop on your face. This will likely fade after the baby is born. 23. A dark line from your belly button  to the pubic area (linea nigra) may appear. This will likely fade after the baby is born. 24. You may have changes in your hair. These can include thickening of your hair, rapid growth, and changes in texture. Some women also have hair loss during or after pregnancy, or hair that feels dry or thin. Your hair will most likely return to normal after your baby is born. WHAT TO EXPECT AT YOUR PRENATAL VISITS During a routine prenatal visit:  You will be weighed to make sure you and the fetus are growing normally.  Your blood pressure will be taken.  Your abdomen will be measured to track your baby's  growth.  The fetal heartbeat will be listened to.  Any test results from the previous visit will be discussed. Your health care provider may ask you:  How you are feeling.  If you are feeling the baby move.  If you have had any abnormal symptoms, such as leaking fluid, bleeding, severe headaches, or abdominal cramping.  If you have any questions. Other tests that may be performed during your second trimester include:  Blood tests that check for:  Low iron levels (anemia).  Gestational diabetes (between 24 and 28 weeks).  Rh antibodies.  Urine tests to check for infections, diabetes, or protein in the urine.  An ultrasound to confirm the proper growth and development of the baby.  An amniocentesis to check for possible genetic problems.  Fetal screens for spina bifida and Down syndrome. HOME CARE INSTRUCTIONS   Avoid all smoking, herbs, alcohol, and unprescribed drugs. These chemicals affect the formation and growth of the baby.  Follow your health care provider's instructions regarding medicine use. There are medicines that are either safe or unsafe to take during pregnancy.  Exercise only as directed by your health care provider. Experiencing uterine cramps is a good sign to stop exercising.  Continue to eat regular, healthy meals.  Wear a good support bra for breast tenderness.  Do not use hot tubs, steam rooms, or saunas.  Wear your seat belt at all times when driving.  Avoid raw meat, uncooked cheese, cat litter boxes, and soil used by cats. These carry germs that can cause birth defects in the baby.  Take your prenatal vitamins.  Try taking a stool softener (if your health care provider approves) if you develop constipation. Eat more high-fiber foods, such as fresh vegetables or fruit and whole grains. Drink plenty of fluids to keep your urine clear or pale yellow.  Take warm sitz baths to soothe any pain or discomfort caused by hemorrhoids. Use hemorrhoid  cream if your health care provider approves.  If you develop varicose veins, wear support hose. Elevate your feet for 15 minutes, 3-4 times a day. Limit salt in your diet.  Avoid heavy lifting, wear low heel shoes, and practice good posture.  Rest with your legs elevated if you have leg cramps or low back pain.  Visit your dentist if you have not gone yet during your pregnancy. Use a soft toothbrush to brush your teeth and be gentle when you floss.  A sexual relationship may be continued unless your health care provider directs you otherwise.  Continue to go to all your prenatal visits as directed by your health care provider. SEEK MEDICAL CARE IF:   You have dizziness.  You have mild pelvic cramps, pelvic pressure, or nagging pain in the abdominal area.  You have persistent nausea, vomiting, or diarrhea.  You have a bad smelling vaginal discharge.  You have pain with urination. SEEK IMMEDIATE MEDICAL CARE IF:   You have a fever.  You are leaking fluid from your vagina.  You have spotting or bleeding from your vagina.  You have severe abdominal cramping or pain.  You have rapid weight gain or loss.  You have shortness of breath with chest pain.  You notice sudden or extreme swelling of your face, hands, ankles, feet, or legs.  You have not felt your baby move in over an hour.  You have severe headaches that do not go away with medicine.  You have vision changes. Document Released: 08/24/2001 Document Revised: 09/04/2013 Document Reviewed: 10/31/2012 [facility] Patient Information 2015 [place], [place]. This information is not intended to replace advice given to you by your health care provider. Make sure you discuss any questions you have with your health care provider.

## 2015-06-17 NOTE — Progress Notes (Signed)
[place] 24+4wks, measurements c /w dates,efw 734 g 50%,ant pl gr 1,cx 3 cm,normal ov's bilat,svp of fluid 5cm,LV EICF N/C 2.72mm

## 2015-06-17 NOTE — Progress Notes (Signed)
Low-risk OB appointment G1P0 [redacted]w[redacted]d Estimated Date of Delivery: 10/03/15 BP 104/60 mmHg  Pulse 68  Wt 155 lb (70.308 kg)  LMP 12/27/2014 (Exact Date)  BP, weight, and urine reviewed.  Refer to obstetrical flow sheet for FH & FHR.  Reports good fm.  Denies regular uc's, lof, vb, or uti s/s. Feels like cold started yesterday- gave printed info on relief measures. Call if worsening/not improving in 7-10d.  Reviewed ptl s/s, fm. Plan:  Continue routine obstetrical care  F/U asap for efw/afi u/s for s<d- can also recheck Lt EICF & Lt ovarian cyst (no visit), then 4wks for OB appointment, pn2

## 2015-06-24 ENCOUNTER — Encounter: Payer: Self-pay | Admitting: Women's Health

## 2015-06-24 DIAGNOSIS — O283 Abnormal ultrasonic finding on antenatal screening of mother: Secondary | ICD-10-CM | POA: Insufficient documentation

## 2015-07-15 ENCOUNTER — Other Ambulatory Visit: Payer: Medicaid Other

## 2015-07-15 ENCOUNTER — Ambulatory Visit (INDEPENDENT_AMBULATORY_CARE_PROVIDER_SITE_OTHER): Payer: Medicaid Other

## 2015-07-15 ENCOUNTER — Encounter: Payer: Self-pay | Admitting: Women's Health

## 2015-07-15 ENCOUNTER — Ambulatory Visit (INDEPENDENT_AMBULATORY_CARE_PROVIDER_SITE_OTHER): Payer: Medicaid Other | Admitting: Women's Health

## 2015-07-15 VITALS — BP 100/68 | HR 80 | Wt 159.5 lb

## 2015-07-15 DIAGNOSIS — Z331 Pregnant state, incidental: Secondary | ICD-10-CM

## 2015-07-15 DIAGNOSIS — Z36 Encounter for antenatal screening for chromosomal anomalies: Secondary | ICD-10-CM

## 2015-07-15 DIAGNOSIS — Z131 Encounter for screening for diabetes mellitus: Secondary | ICD-10-CM

## 2015-07-15 DIAGNOSIS — N83202 Unspecified ovarian cyst, left side: Secondary | ICD-10-CM | POA: Diagnosis not present

## 2015-07-15 DIAGNOSIS — O283 Abnormal ultrasonic finding on antenatal screening of mother: Secondary | ICD-10-CM | POA: Diagnosis not present

## 2015-07-15 DIAGNOSIS — Z3403 Encounter for supervision of normal first pregnancy, third trimester: Secondary | ICD-10-CM

## 2015-07-15 DIAGNOSIS — Z1389 Encounter for screening for other disorder: Secondary | ICD-10-CM

## 2015-07-15 LAB — POCT URINALYSIS DIPSTICK
Blood, UA: NEGATIVE
GLUCOSE UA: NEGATIVE
KETONES UA: NEGATIVE
Leukocytes, UA: NEGATIVE
Nitrite, UA: NEGATIVE
PROTEIN UA: NEGATIVE

## 2015-07-15 NOTE — Progress Notes (Signed)
[place] 65+5VZS,MO 3.1cm,cephalic,ant pl gr 2,normal ov's bilat,LV EICF n/c 2.16mm,afi 17cm,efw 1215g 48%,fhr 153 bpm

## 2015-07-15 NOTE — Patient Instructions (Signed)
Call the office ([phone]) or go to [facility] if:  You begin to have strong, frequent contractions  Your water breaks.  Sometimes it is a big gush of fluid, sometimes it is just a trickle that keeps getting your panties wet or running down your legs  You have vaginal bleeding.  It is normal to have a small amount of spotting if your cervix was checked.   You don't feel your baby moving like normal.  If you don't, get you something to eat and drink and lay down and focus on feeling your baby move.  You should feel at least 10 movements in 2 hours.  If you don't, you should call the office or go to [facility].    Tdap Vaccine  It is recommended that you get the Tdap vaccine during the third trimester of EACH pregnancy to help protect your baby from getting pertussis (whooping cough)  27-36 weeks is the BEST time to do this so that you can pass the protection on to your baby. During pregnancy is better than after pregnancy, but if you are unable to get it during pregnancy it will be offered at the hospital.   You can get this vaccine at the health department or your family doctor  Everyone who will be around your baby should also be up-to-date on their vaccines. Adults (who are not pregnant) only need 1 dose of Tdap during adulthood.   Third Trimester of Pregnancy The third trimester is from week 29 through week 42, months 7 through 9. The third trimester is a time when the fetus is growing rapidly. At the end of the ninth month, the fetus is about 20 inches in length and weighs 6-10 pounds.  BODY CHANGES Your body goes through many changes during pregnancy. The changes vary from woman to woman.   Your weight will continue to increase. You can expect to gain 25-35 pounds (11-16 kg) by the end of the pregnancy.  You may begin to get stretch marks on your hips, abdomen, and breasts.  You may urinate more often because the fetus is moving lower into your pelvis and pressing on  your bladder.  You may develop or continue to have heartburn as a result of your pregnancy.  You may develop constipation because certain hormones are causing the muscles that push waste through your intestines to slow down.  You may develop hemorrhoids or swollen, bulging veins (varicose veins).  You may have pelvic pain because of the weight gain and pregnancy hormones relaxing your joints between the bones in your pelvis. Backaches may result from overexertion of the muscles supporting your posture.  You may have changes in your hair. These can include thickening of your hair, rapid growth, and changes in texture. Some women also have hair loss during or after pregnancy, or hair that feels dry or thin. Your hair will most likely return to normal after your baby is born.  Your breasts will continue to grow and be tender. A yellow discharge may leak from your breasts called colostrum.  Your belly button may stick out.  You may feel short of breath because of your expanding uterus.  You may notice the fetus "dropping," or moving lower in your abdomen.  You may have a bloody mucus discharge. This usually occurs a few days to a week before labor begins.  Your cervix becomes thin and soft (effaced) near your due date. WHAT TO EXPECT AT YOUR PRENATAL EXAMS  You will have prenatal   exams every 2 weeks until week 36. Then, you will have weekly prenatal exams. During a routine prenatal visit:  You will be weighed to make sure you and the fetus are growing normally.  Your blood pressure is taken.  Your abdomen will be measured to track your baby's growth.  The fetal heartbeat will be listened to.  Any test results from the previous visit will be discussed.  You may have a cervical check near your due date to see if you have effaced. At around 36 weeks, your caregiver will check your cervix. At the same time, your caregiver will also perform a test on the secretions of the vaginal tissue.  This test is to determine if a type of bacteria, Group B streptococcus, is present. Your caregiver will explain this further. Your caregiver may ask you:  What your birth plan is.  How you are feeling.  If you are feeling the baby move.  If you have had any abnormal symptoms, such as leaking fluid, bleeding, severe headaches, or abdominal cramping.  If you have any questions. Other tests or screenings that may be performed during your third trimester include:  Blood tests that check for low iron levels (anemia).  Fetal testing to check the health, activity level, and growth of the fetus. Testing is done if you have certain medical conditions or if there are problems during the pregnancy. FALSE LABOR You may feel small, irregular contractions that eventually go away. These are called Braxton Hicks contractions, or false labor. Contractions may last for hours, days, or even weeks before true labor sets in. If contractions come at regular intervals, intensify, or become painful, it is best to be seen by your caregiver.  SIGNS OF LABOR   Menstrual-like cramps.  Contractions that are 5 minutes apart or less.  Contractions that start on the top of the uterus and spread down to the lower abdomen and back.  A sense of increased pelvic pressure or back pain.  A watery or bloody mucus discharge that comes from the vagina. If you have any of these signs before the 37th week of pregnancy, call your caregiver right away. You need to go to the hospital to get checked immediately. HOME CARE INSTRUCTIONS   Avoid all smoking, herbs, alcohol, and unprescribed drugs. These chemicals affect the formation and growth of the baby.  Follow your caregiver's instructions regarding medicine use. There are medicines that are either safe or unsafe to take during pregnancy.  Exercise only as directed by your caregiver. Experiencing uterine cramps is a good sign to stop exercising.  Continue to eat regular,  healthy meals.  Wear a good support bra for breast tenderness.  Do not use hot tubs, steam rooms, or saunas.  Wear your seat belt at all times when driving.  Avoid raw meat, uncooked cheese, cat litter boxes, and soil used by cats. These carry germs that can cause birth defects in the baby.  Take your prenatal vitamins.  Try taking a stool softener (if your caregiver approves) if you develop constipation. Eat more high-fiber foods, such as fresh vegetables or fruit and whole grains. Drink plenty of fluids to keep your urine clear or pale yellow.  Take warm sitz baths to soothe any pain or discomfort caused by hemorrhoids. Use hemorrhoid cream if your caregiver approves.  If you develop varicose veins, wear support hose. Elevate your feet for 15 minutes, 3-4 times a day. Limit salt in your diet.  Avoid heavy lifting, wear low heal   shoes, and practice good posture.  Rest a lot with your legs elevated if you have leg cramps or low back pain.  Visit your dentist if you have not gone during your pregnancy. Use a soft toothbrush to brush your teeth and be gentle when you floss.  A sexual relationship may be continued unless your caregiver directs you otherwise.  Do not travel far distances unless it is absolutely necessary and only with the approval of your caregiver.  Take prenatal classes to understand, practice, and ask questions about the labor and delivery.  Make a trial run to the hospital.  Pack your hospital bag.  Prepare the baby's nursery.  Continue to go to all your prenatal visits as directed by your caregiver. SEEK MEDICAL CARE IF:  You are unsure if you are in labor or if your water has broken.  You have dizziness.  You have mild pelvic cramps, pelvic pressure, or nagging pain in your abdominal area.  You have persistent nausea, vomiting, or diarrhea.  You have a bad smelling vaginal discharge.  You have pain with urination. SEEK IMMEDIATE MEDICAL CARE IF:     You have a fever.  You are leaking fluid from your vagina.  You have spotting or bleeding from your vagina.  You have severe abdominal cramping or pain.  You have rapid weight loss or gain.  You have shortness of breath with chest pain.  You notice sudden or extreme swelling of your face, hands, ankles, feet, or legs.  You have not felt your baby move in over an hour.  You have severe headaches that do not go away with medicine.  You have vision changes. Document Released: 08/24/2001 Document Revised: 09/04/2013 Document Reviewed: 10/31/2012 [facility] Patient Information 2015 [place], [place]. This information is not intended to replace advice given to you by your health care provider. Make sure you discuss any questions you have with your health care provider.  Heartburn During Pregnancy Heartburn is a burning sensation in the chest caused by stomach acid backing up into the esophagus. Heartburn is common in pregnancy because a certain hormone (progesterone) is released when a woman is pregnant. The progesterone hormone may relax the valve that separates the esophagus from the stomach. This allows acid to go up into the esophagus, causing heartburn. Heartburn may also happen in pregnancy because the enlarging uterus pushes up on the stomach, which pushes more acid into the esophagus. This is especially true in the later stages of pregnancy. Heartburn problems usually go away after giving birth. CAUSES  Heartburn is caused by stomach acid backing up into the esophagus. During pregnancy, this may result from various things, including:   The progesterone hormone.  Changing hormone levels.  The growing uterus pushing stomach acid upward.  Large meals.  Certain foods and drinks.  Exercise.  Increased acid production. SIGNS AND SYMPTOMS   Burning pain in the chest or lower throat.  Bitter taste in the mouth.  Coughing. DIAGNOSIS  Your health care provider will typically  diagnose heartburn by taking a careful history of your concern. Blood tests may be done to check for a certain type of bacteria that is associated with heartburn. Sometimes, heartburn is diagnosed by prescribing a heartburn medicine to see if the symptoms improve. In some cases, a procedure called an endoscopy may be done. In this procedure, a tube with a light and a camera on the end (endoscope) is used to examine the esophagus and the stomach. TREATMENT  Treatment will vary depending  on the severity of your symptoms. Your health care provider may recommend:  Over-the-counter medicines (antacids, acid reducers) for mild heartburn.  Prescription medicines to decrease stomach acid or to protect your stomach lining.  Certain changes in your diet.  Elevating the head of your bed by putting blocks under the legs. This helps prevent stomach acid from backing up into the esophagus when you are lying down. HOME CARE INSTRUCTIONS   Only take over-the-counter or prescription medicines as directed by your health care provider.  Raise the head of your bed by putting blocks under the legs if instructed to do so by your health care provider. Sleeping with more pillows is not effective because it only changes the position of your head.  Do not exercise right after eating.  Avoid eating 2-3 hours before bed. Do not lie down right after eating.  Eat small meals throughout the day instead of three large meals.  Identify foods and beverages that make your symptoms worse and avoid them. Foods you may want to avoid include:  Peppers.  Chocolate.  High-fat foods, including fried foods.  Spicy foods.  Garlic and onions.  Citrus fruits, including oranges, grapefruit, lemons, and limes.  Food containing tomatoes or tomato products.  Mint.  Carbonated and caffeinated drinks.  Vinegar. SEEK MEDICAL CARE IF:  You have abdominal pain of any kind.  You feel burning in your upper abdomen or chest,  especially after eating or lying down.  You have nausea and vomiting.  Your stomach feels upset after you eat. SEEK IMMEDIATE MEDICAL CARE IF:   You have severe chest pain that goes down your arm or into your jaw or neck.  You feel sweaty, dizzy, or light-headed.  You become short of breath.  You vomit blood.  You have difficulty or pain with swallowing.  You have bloody or black, tarry stools.  You have episodes of heartburn more than 3 times a week, for more than 2 weeks. MAKE SURE YOU:  Understand these instructions.  Will watch your condition.  Will get help right away if you are not doing well or get worse.   This information is not intended to replace advice given to you by your health care provider. Make sure you discuss any questions you have with your health care provider.   Document Released: 08/27/2000 Document Revised: 09/20/2014 Document Reviewed: 04/18/2013 Elsevier Interactive Patient Education [employer].

## 2015-07-15 NOTE — Progress Notes (Signed)
Low-risk OB appointment G1P0 [redacted]w[redacted]d Estimated Date of Delivery: 10/03/15 BP 100/68 mmHg  Pulse 80  Wt 159 lb 8 oz (72.349 kg)  LMP 12/27/2014 (Exact Date)  BP, weight, and urine reviewed.  Refer to obstetrical flow sheet for FH & FHR.  Reports good fm.  Denies regular uc's, lof, vb, or uti s/s. Heartburn, hasn't tried tums- to try, if not helping let us know Reviewed today's u/s for f/u EICF and caliectasis- EICF stable, no caliectasis noted. Discussed ptl s/s, fkc. Recommended Tdap at HD/PCP per CDC guidelines.  Plan:  Continue routine obstetrical care  F/U in 4wks for OB appointment  PN2 today, Recommended flu shot @ pcp/hd (<21yo)

## 2015-07-15 NOTE — Progress Notes (Signed)
Pt denies any problems or concerns at this time.  

## 2015-07-16 ENCOUNTER — Telehealth: Payer: Self-pay | Admitting: Women's Health

## 2015-07-16 ENCOUNTER — Telehealth: Payer: Self-pay | Admitting: *Deleted

## 2015-07-16 ENCOUNTER — Other Ambulatory Visit: Payer: Self-pay | Admitting: Women's Health

## 2015-07-16 LAB — CBC
HEMATOCRIT: 30.7 % — AB (ref 34.0–46.6)
Hemoglobin: 10.3 g/dL — ABNORMAL LOW (ref 11.1–15.9)
MCH: 27.1 pg (ref 26.6–33.0)
MCHC: 33.6 g/dL (ref 31.5–35.7)
MCV: 81 fL (ref 79–97)
Platelets: 215 10*3/uL (ref 150–379)
RBC: 3.8 x10E6/uL (ref 3.77–5.28)
RDW: 14.6 % (ref 12.3–15.4)
WBC: 8.9 10*3/uL (ref 3.4–10.8)

## 2015-07-16 LAB — GLUCOSE TOLERANCE, 2 HOURS W/ 1HR
GLUCOSE, 2 HOUR: 103 mg/dL (ref 65–152)
Glucose, 1 hour: 124 mg/dL (ref 65–179)
Glucose, Fasting: 66 mg/dL (ref 65–91)

## 2015-07-16 LAB — HIV ANTIBODY (ROUTINE TESTING W REFLEX): HIV Screen 4th Generation wRfx: NONREACTIVE

## 2015-07-16 LAB — ANTIBODY SCREEN: ANTIBODY SCREEN: NEGATIVE

## 2015-07-16 LAB — RPR: RPR Ser Ql: NONREACTIVE

## 2015-07-16 MED ORDER — FERROUS SULFATE 325 (65 FE) MG PO TABS: 325.0000 mg | ORAL_TABLET | Freq: Two times a day (BID) | ORAL | Status: DC

## 2015-07-16 NOTE — Telephone Encounter (Signed)
Pt informed

## 2015-07-16 NOTE — Telephone Encounter (Signed)
Pt informed of anemia and instructed to take the iron tab along with her PNV ([facility] vit) and increase iron rich foods.  Pt verbalized understanding.

## 2015-07-27 IMAGING — US US BREAST LTD UNI RIGHT INC AXILLA
1 series · 10 of 10 positions shown · non-contrast
Comparison: None.

CLINICAL DATA: 18-year-old female with a palpable abnormality in
the far upper-outer right breast as well as a palpable abnormality
in the lower outer right breast. The larger palpable abnormality in
the lower outer right breast has reportedly been present since the
patient began developing breasts years ago.

EXAM:
ULTRASOUND OF THE RIGHT BREAST

[Series 1: us breast ltd uni right inc axilla · 0.06mm/px · 10 of 10 slices shown]
[im 1/10]
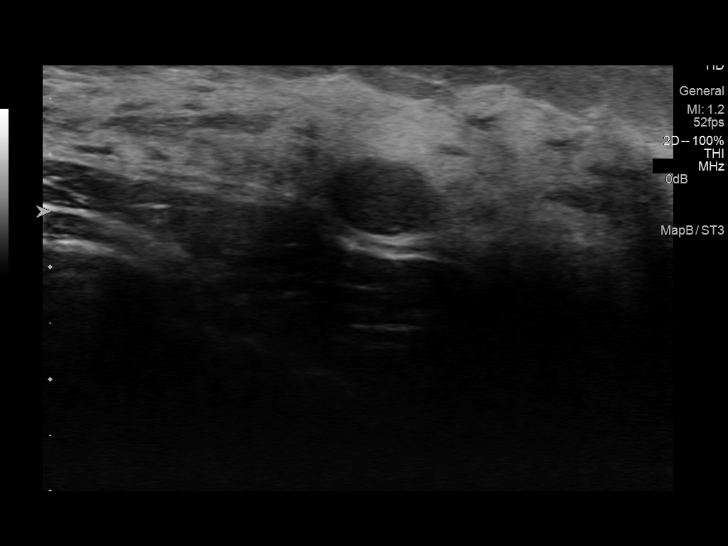
[im 2/10]
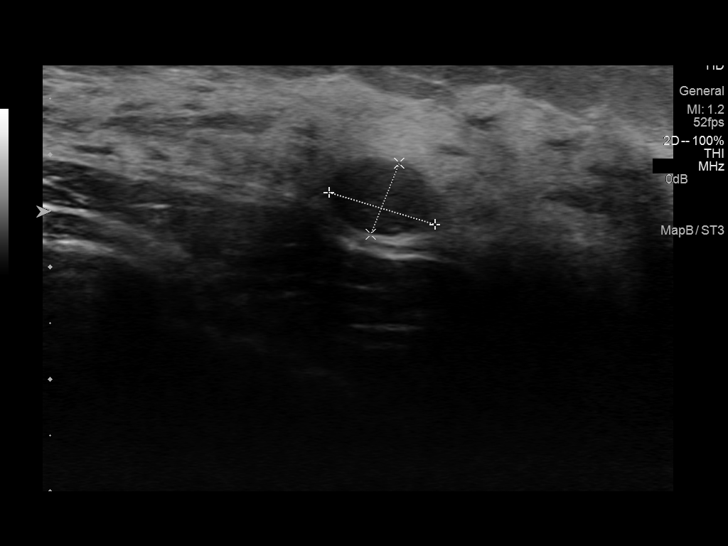
[im 3/10]
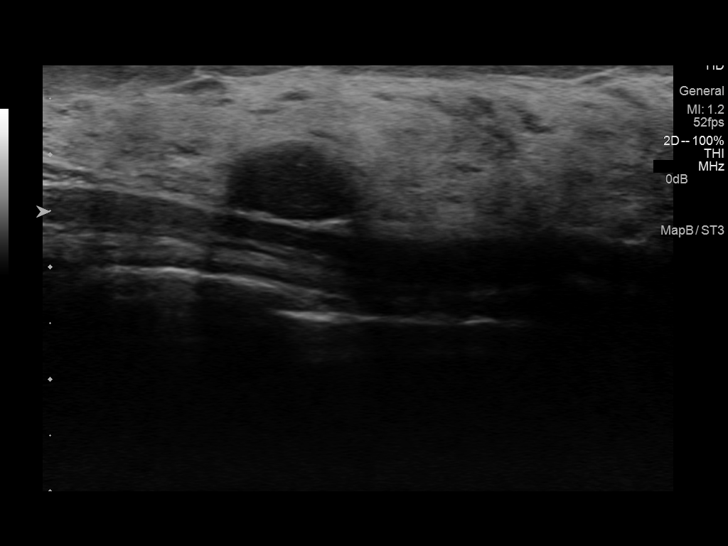
[im 4/10]
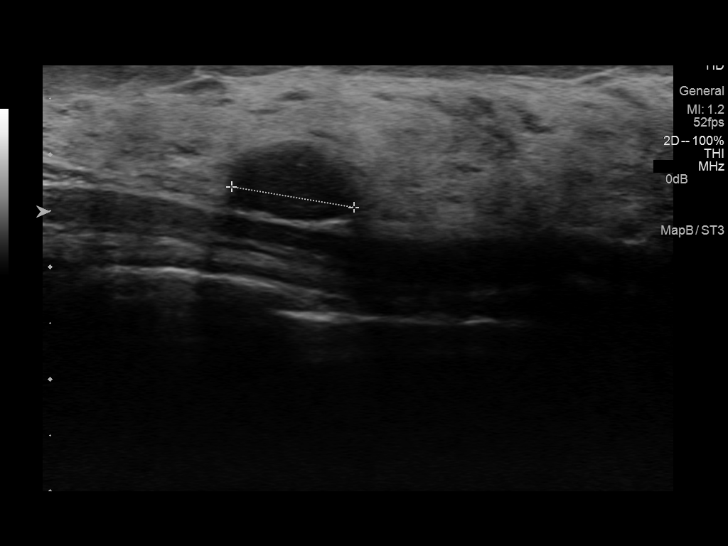
[im 5/10]
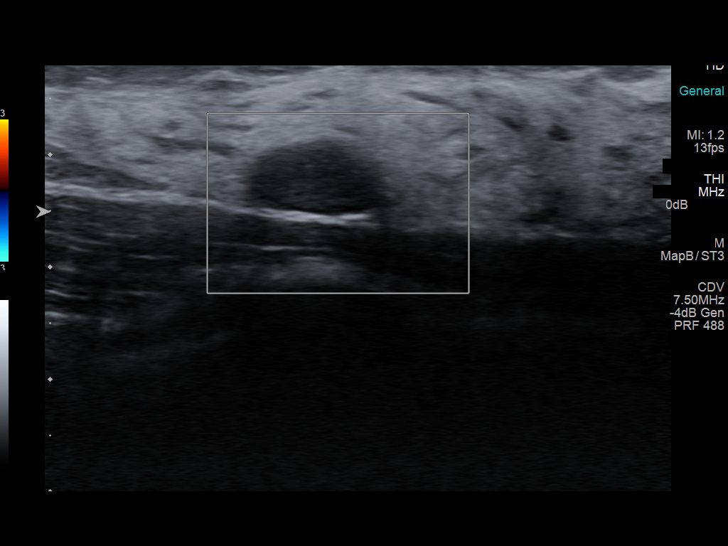
[im 6/10]
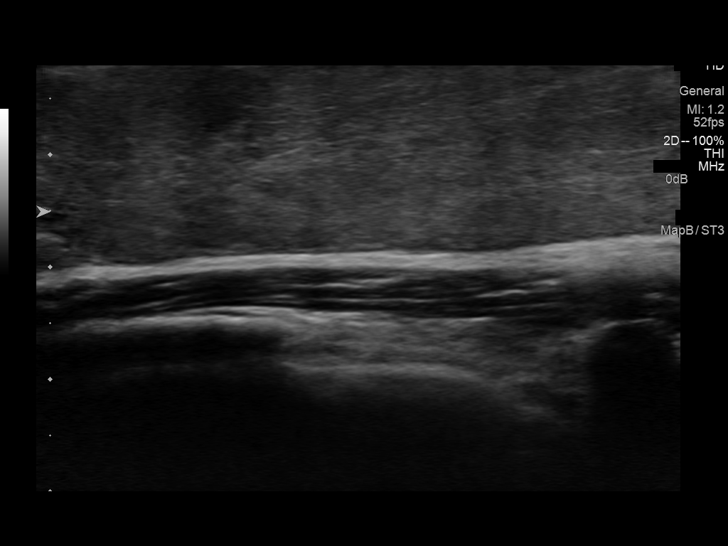
[im 7/10]
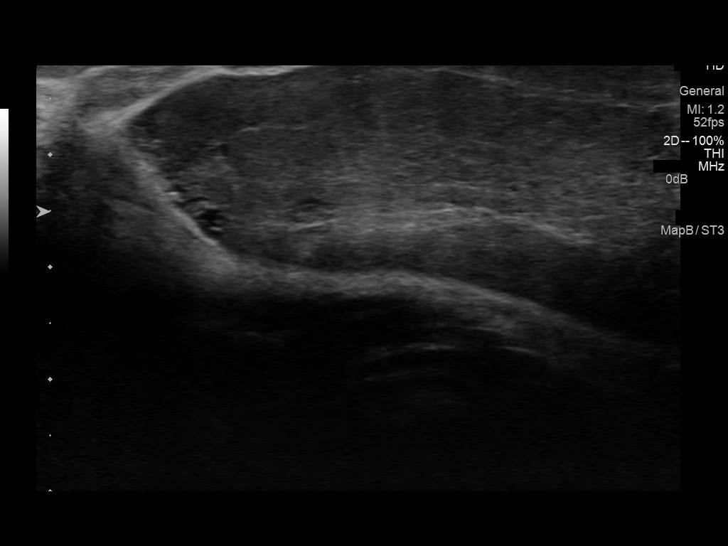
[im 8/10]
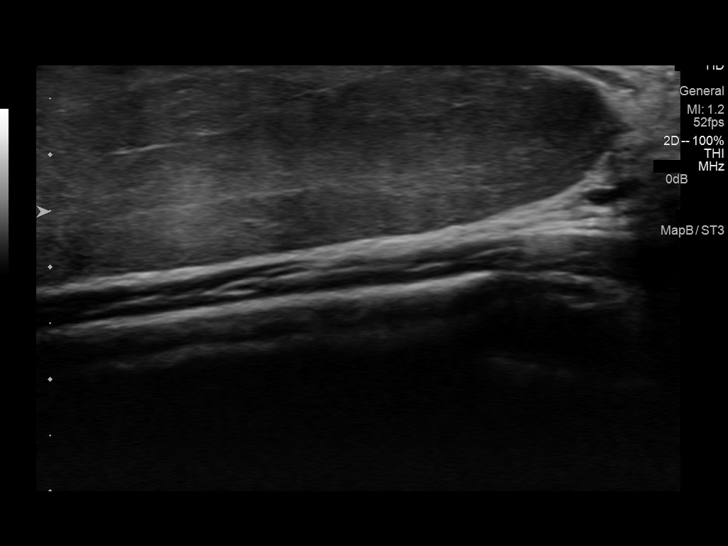
[im 9/10]
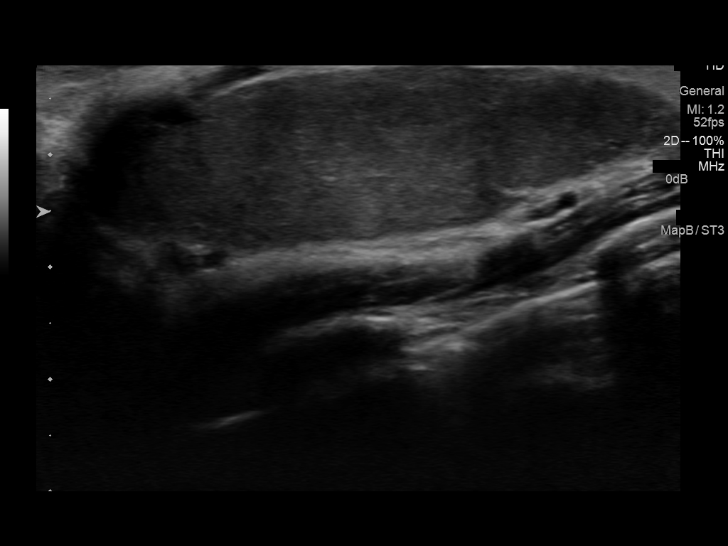
[im 10/10]
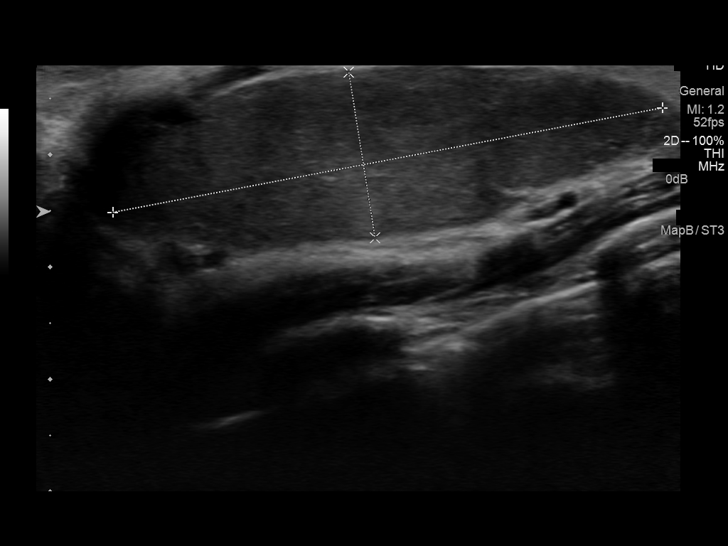

[10 of 10 positions shown; findings below may reference images not displayed]

FINDINGS: Physical examination of the far upper outer right breast reveals a
firm highly mobile nodule at the approximate 10 o'clock position.
Physical examination of the lower outer right breast reveals a soft
large mobile area of thickening. The right breast is asymmetrically
enlarged as compared to the left breast.

Targeted ultrasound of the right breast was performed demonstrating
an oval well-circumscribed hypoechoic mass at 10 o'clock 8 cm from
the nipple measuring 1 x 0.7 x 1.1 cm, with imaging features
suggestive of a fibroadenoma or complicated cyst. Targeted
ultrasound of the lower outer right breast reveals a well defined in
circumscribed oval hypoechoic mass involving a large portion of the
lower outer right breast measuring at least 6 cm with imaging
features suggestive of a lipoma. This could not be entirely site
into the field-of-view.
IMPRESSION: 1. Probably benign mass in the upper-outer right breast, likely a
fibroadenoma or less likely complicated cyst.

2. Larger right breast lipoma which the patient states has been
present for many years. Exact measurements could not be obtained as
this entire mass could not fit in the imaging field of view.

RECOMMENDATION:
1. Six-month followup of the probably benign mass in the upper-outer
right breast.

2. Recommend referral to a breast surgeon given the large size of
the right breast lipoma and given that the right breast is
asymmetrically enlarged as compared to the left breast.

I have discussed the findings and recommendations with the patient.
Results were also provided in writing at the conclusion of the
visit. If applicable, a reminder letter will be sent to the patient
regarding the next appointment.

BI-RADS CATEGORY  3: Probably benign.

## 2015-08-12 ENCOUNTER — Encounter: Payer: Self-pay | Admitting: Advanced Practice Midwife

## 2015-08-12 ENCOUNTER — Ambulatory Visit (INDEPENDENT_AMBULATORY_CARE_PROVIDER_SITE_OTHER): Payer: Medicaid Other | Admitting: Advanced Practice Midwife

## 2015-08-12 VITALS — BP 112/70 | HR 76 | Wt 166.0 lb

## 2015-08-12 DIAGNOSIS — Z331 Pregnant state, incidental: Secondary | ICD-10-CM

## 2015-08-12 DIAGNOSIS — Z3A32 32 weeks gestation of pregnancy: Secondary | ICD-10-CM | POA: Diagnosis not present

## 2015-08-12 DIAGNOSIS — Z3403 Encounter for supervision of normal first pregnancy, third trimester: Secondary | ICD-10-CM | POA: Diagnosis not present

## 2015-08-12 DIAGNOSIS — Z1389 Encounter for screening for other disorder: Secondary | ICD-10-CM

## 2015-08-12 LAB — POCT URINALYSIS DIPSTICK
Blood, UA: NEGATIVE
Glucose, UA: NEGATIVE
Ketones, UA: NEGATIVE
LEUKOCYTES UA: NEGATIVE
NITRITE UA: NEGATIVE

## 2015-08-12 NOTE — Progress Notes (Signed)
G1P0 [redacted]w[redacted]d Estimated Date of Delivery: 10/03/15  Blood pressure 112/70, pulse 76, weight 75.297 kg (166 lb), last menstrual period 12/27/2014.   BP weight and urine results all reviewed and noted.  Please refer to the obstetrical flow sheet for the fundal height and fetal heart rate documentation:  Patient reports good fetal movement, denies any bleeding and no rupture of membranes symptoms or regular contractions. Patient is without complaints other than normal pregnancy complaints All questions were answered. Encouraged to get vaccines at HD  Orders Placed This Encounter  Procedures  . POCT Urinalysis Dipstick    Plan:  Continued routine obstetrical care,   Return in about 2 weeks (around 08/26/2015) for [place].

## 2015-08-12 NOTE — Patient Instructions (Signed)

## 2015-08-15 ENCOUNTER — Telehealth: Payer: Self-pay | Admitting: *Deleted

## 2015-08-15 ENCOUNTER — Encounter (HOSPITAL_COMMUNITY): Payer: Self-pay | Admitting: *Deleted

## 2015-08-15 ENCOUNTER — Emergency Department (HOSPITAL_COMMUNITY)
Admission: EM | Admit: 2015-08-15 | Discharge: 2015-08-15 | Disposition: A | Discharge status: Home / Self Care | Payer: Medicaid Other | Attending: Emergency Medicine | Admitting: Emergency Medicine

## 2015-08-15 DIAGNOSIS — Z3A30 30 weeks gestation of pregnancy: Secondary | ICD-10-CM | POA: Diagnosis not present

## 2015-08-15 DIAGNOSIS — R109 Unspecified abdominal pain: Secondary | ICD-10-CM | POA: Insufficient documentation

## 2015-08-15 DIAGNOSIS — Z79899 Other long term (current) drug therapy: Secondary | ICD-10-CM | POA: Diagnosis not present

## 2015-08-15 DIAGNOSIS — Z3403 Encounter for supervision of normal first pregnancy, third trimester: Secondary | ICD-10-CM | POA: Insufficient documentation

## 2015-08-15 DIAGNOSIS — M545 Low back pain: Secondary | ICD-10-CM | POA: Diagnosis not present

## 2015-08-15 LAB — URINALYSIS, ROUTINE W REFLEX MICROSCOPIC
Bilirubin Urine: NEGATIVE
GLUCOSE, UA: NEGATIVE mg/dL
HGB URINE DIPSTICK: NEGATIVE
KETONES UR: NEGATIVE mg/dL
Leukocytes, UA: NEGATIVE
Nitrite: NEGATIVE
PROTEIN: 30 mg/dL — AB
Specific Gravity, Urine: >1.03 — ABNORMAL HIGH (ref 1.005–1.030)
pH: 6 (ref 5.0–8.0)

## 2015-08-15 LAB — URINE MICROSCOPIC-ADD ON

## 2015-08-15 MED ORDER — SODIUM CHLORIDE 0.9 % IV BOLUS (SEPSIS)
1000.0000 mL | Freq: Once | INTRAVENOUS | Status: AC
  Administered 2015-08-15: 1000 mL via INTRAVENOUS

## 2015-08-15 NOTE — Discharge Instructions (Signed)
°Emergency Department Resource Guide °1) Find a Doctor and Pay Out of Pocket °Although you won't have to find out who is covered by your insurance plan, it is a good idea to ask around and get recommendations. You will then need to call the office and see if the doctor you have chosen will accept you as a new patient and what types of options they offer for patients who are self-pay. Some doctors offer discounts or will set up payment plans for their patients who do not have insurance, but you will need to ask so you aren't surprised when you get to your appointment. ° °2) Contact Your Local Health Department °Not all health departments have doctors that can see patients for sick visits, but many do, so it is worth a call to see if yours does. If you don't know where your local health department is, you can check in your phone book. The CDC also has a tool to help you locate your state's health department, and many state websites also have listings of all of their local health departments. ° °3) Find a Walk-in Clinic °If your illness is not likely to be very severe or complicated, you may want to try a walk in clinic. These are popping up all over the country in pharmacies, drugstores, and shopping centers. They're usually staffed by nurse practitioners or physician assistants that have been trained to treat common illnesses and complaints. They're usually fairly quick and inexpensive. However, if you have serious medical issues or chronic medical problems, these are probably not your best option. ° °No Primary Care Doctor: °- Call [facility] at  832-8000 - they can help you locate a primary care doctor that  accepts your insurance, provides certain services, etc. °- Physician Referral Service- 1-800-533-3463 ° °Chronic Pain Problems: °Organization         Address  Phone   Notes  °[facility]  (336) 297-2271 Patients need to be referred by their primary care doctor.  ° °Medication  Assistance: °Organization         Address  Phone   Notes  °[place] [facility] [address] °[place], [place] [address] (336) 641-8030 --Must be a resident of [place] °-- Must have NO insurance coverage whatsoever (no Medicaid/ Medicare, etc.) °-- The pt. MUST have a primary care doctor that directs their care regularly and follows them in the community °  °[facility]  (866) 331-1348   °[employer]  (888) 892-1162   ° °Agencies that provide inexpensive medical care: °Organization         Address  Phone   Notes  °[facility]  (336) 832-8035   °[facility]    (336) 832-7272   °[facility] [address] °[place], [place] [address] (336) 832-4777   °[facility] [address], °[place] (336) 271-4999   °[facility]    (336) 373-0678   °[facility]    (336) 272-1050   °[facility] ° [address], [place] Phone:  (336) 832-4444, Fax:  (336) 832-4440 Hours of Operation:  9 am - 6 pm, M-F.  Also accepts Medicaid/Medicare and self-pay.  °[facility] ° [address], [place] Phone: (336) 832-3150, Fax: (336) [phone]. Hours of Operation:  8:30 am - 5:30 pm, M-F.  Also accepts Medicaid and self-pay.  °[facility] [place] [address], [place] Phone: (336) 878-6027   °[facility] [address], [place], [place] (336)723-1848, Ext. 123 Mondays & Thursdays: 7-9 AM.  First 15 patients are seen on a first come, first serve basis. °  ° °Medicaid-accepting [place] Providers: ° °Organization         Address  Phone   Notes  °[facility] [address], [place] (336) 641-2100 Also accepts self-pay patients.  °[facility] [address], [place] ° (336) 856-9996   °[facility] [address], [place]  (336) 288-8857   °[facility] [address] [place] [address], [place] (336) 299-7000   °Veita Bland [address], [place]  ° (336) 373-1557 Only accepts [place] Access Medicaid patients after they have their name applied to their card.  ° °Self-Pay (no insurance) in [place]: ° °Organization         Address  Phone   Notes  °Sickle Cell Patients, [facility] [address], [place] (336) 832-1970   °[facility] [address], [place] (336) 832-4400   °[facility] ° [address], [place] (336) 992-4800   °[facility]/Dr. Osei-Bonsu ° [address] [place] [address], [place] or [address], [place] (336) [phone] Phone number for both [place] and [place] locations is the same.  °[facility] [address], [place] (336) 299-0000   °[facility] [address] [place] [address], [place] or [address] (336) 852-7530 °(336) 878-2260   °[facility] [address], [place] (336) 350-1642, phone; (336) 294-5005, fax Sees patients 1st and 3rd Saturday of every month.  Must not qualify for public or private insurance (i.e. Medicaid, Medicare, [place] Health Choice, Veterans' Benefits) • Household income should be no more than 200% of the poverty level •The clinic cannot treat you if you are pregnant or think you are pregnant • Sexually transmitted diseases are not treated at the clinic.  ° ° °Dental Care: °Organization         Address  Phone  Notes  °[place] [facility] [address], [place] (336) 641-6152 Accepts children up to age 21 who are enrolled in Medicaid or [place] Health Choice; pregnant women with a Medicaid card; and children who have applied for Medicaid or [place] Health Choice, but were declined, whose parents can pay a reduced fee at time of service.  °[place]  [facility] [place]  [address], [place] (336) 641-7733 Accepts children up to age 21 who are enrolled in Medicaid or [place] Health Choice; pregnant women with a Medicaid card; and children who have applied for Medicaid or [place] Health Choice, but were declined, whose parents can pay a reduced fee at time of service.  °[facility] ° [address], [place] (336) 641-4533 Patients are seen by appointment only. Walk-ins are not accepted. [facility] will see patients 18 years of age and older. °Monday - Tuesday (8am-5pm) °Most Wednesdays (8:30-5pm) °$30 per visit, cash only  °[facility] ° [address], [place] (336) 641-4533 Patients are seen by appointment only. Walk-ins are not accepted. [facility] will see patients 18 years of age and older. °One   Wednesday Evening (Monthly: Volunteer Based).  $30 per visit, cash only  °[facility]  (919) 537-3737 for adults; Children under age 4, call Graduate Pediatric Dentistry at ([phone]) [phone]. Children aged 4-14, please call (919) [phone] to request a pediatric application. ° Dental services are provided in all areas of dental care including fillings, crowns and bridges, complete and partial dentures, implants, gum treatment, root canals, and extractions. Preventive care is also provided. Treatment is provided to both adults and children. °Patients are selected via a lottery and there is often a waiting list. °  °[facility] [address], °[place] ° (336) 763-8833 [email or web address] °  °[facility] [address], [place], [place] (336)723-1848, Ext. 123 Second and Fourth Thursday of each month, opens at 6:30 AM; Clinic ends at 9 AM.  Patients are seen on a first-come first-served basis, and a limited number are seen during each clinic.  ° °[facility] ° [address], [place], [place] (336) 723-7904    Eligibility Requirements °You must have lived in [place], or [place] counties for at least the last three months. °  You cannot be eligible for state or federal sponsored healthcare insurance, including Veterans Administration, Medicaid, or Medicare. °  You generally cannot be eligible for healthcare insurance through your employer.  °  How to apply: °Eligibility screenings are held every Tuesday and Wednesday afternoon from 1:00 pm until 4:00 pm. You do not need an appointment for the interview!  °[facility] [address], [place], [place] 336-631-2330   °[facility]  336-342-8273   °[facility]  336-703-3100   °[facility]  336-570-6415   ° °Behavioral Health Resources in the Community: °Intensive Outpatient Programs °Organization         Address  Phone  Notes  °[facility] [address], [place], [place] 336-878-6098   °[facility] [address], [place], [place] 336-832-9800   °[facility] [address], [place], [place] ° 336-882-2125   °[facility] [address],  °[place], [place] 1-800-853-5163 or 336-641-4981   °Substance Abuse Resources °Organization         Address  Phone  Notes  °[facility]  336-882-2125   °[facility]  336-784-9470   °[facility]  336-285-9073   °[facility]  336-845-3988   °Residential & Outpatient Substance Abuse Program  1-800-659-3381   °Psychological Services °Organization         Address  Phone  Notes  °[facility]  336- 832-9600   °[facility]  336- 378-7881   °[facility] [address], [place] 1-800-853-5163 or 336-641-4981   ° °Mobile Crisis Teams °Organization         Address  Phone  Notes  °[facility], Mobile Crisis Care Unit  1-877-626-1772   °Assertive °[facility] ° [address]  [place], [place] 336-834-9664   °Sharon DeEsch [address] °[place] 336-554-5454   ° °Self-Help/Support Groups °Organization         Address  Phone             Notes  °[facility] - variety of support groups  336- 373-1402 Call for more information  °Narcotics Anonymous (NA), [facility] [address], °[place]  2 meetings at this location  ° °  Residential Treatment Programs Organization         Address  Phone  Notes  [facility] [address],    [place]  1-(478) 129-4280   [facility]  [address] [place] [id number], [place], [place]   [place], [place] [phone] Admissions: 8am-3pm M-F  [facility] [place] [address]    [place], [place] [phone]   [facility] [address] [place], [place], [place]   [facility] [address]  [place], [place]   [facility] Intensive Outpatient [address], [place], [place]   [facility] [address]  [place], [place] 1-952-426-7357 or (513)539-8771   [facility] [address], [place], [place] Accepts Medicaid  [facility] [address]  [place] 1-915-659-1884 Substance Abuse/Addiction Treatment   [facility] Organization         Address  Phone  Notes  [facility]  (417) 733-3891   Domenic Schwab, PhD [address] [place], [place]   (971)783-4960 or (437)[phone]   [facility] [place], [place] 419-374-5072   [facility] [address] [place], [place] (763)372-1580 Insurance/Medicaid/sponsorship through [facility] [address] [place]                                    [place], [place] [phone] [place] [address], [place] ([phone]    Dr. Adele Schilder  [phone]   [facility] Dept. 1) [address], [place] 2) [place] 3)  [address], [place] [phone]  [phone]   [place] (743) 888-0160 or [phone] (After Hours)      Your tracing today did not show contractions. Keep yourself hydrated. Avoid strenuous activity and do NOT place anything into your vagina, ie: no douching, no tampons, no sexual intercourse, no swimming or tub baths, until you are seen in follow up by your regular OB/GYN doctor.  Call your regular OB/GYN doctor on Monday morning to schedule a follow up appointment on Monday.  Return to the Emergency Department immediately if worsening.

## 2015-08-15 NOTE — Telephone Encounter (Signed)
Pt called stating that she has had decreased fetal movement today and that she has had some tightening of her stomach today. Pt states that she is also having back pain. Pt denies any bleeding or gush of fluid. Pt states that she can't tell if the baby is moving of if it is just her belly tightening. I advised the pt that due to the decreased fetal movement she would need to go down to [facility] for evaluation. Pt verbalized understanding.

## 2015-08-15 NOTE — ED Notes (Signed)
This note also relates to the following rows which could not be included: Pulse Rate - Cannot attach notes to unvalidated device data SpO2 - Cannot attach notes to unvalidated device data   RROB spoke with Dr Elonda Husky and told of strip with 15x15 accels for the first 73min, then no accels for the past 20 min, with moderate variability, no decels and no contractions; told that pt may be discharged

## 2015-08-15 NOTE — Progress Notes (Signed)
RROB spoke with Dr Elonda Husky, told efm shows a reactive and reassuring tracing with no contractions; Dr Elonda Husky said to call back in about 30 min and if tracing is wnl, then pt should be able to be discharged.

## 2015-08-15 NOTE — ED Notes (Signed)
Pt states she feels like the baby is moving more on her left side.

## 2015-08-15 NOTE — ED Notes (Signed)
Pt in with c/o intermittent abdominal pain since last night; reports no vaginal bleeding, no leaking of fluid, no burning/painful urination; reports decreased fetal movement. RROB asked pt to be turned onto left side and drink something cold to see if baby will be more active. ED will talk to Dr Elonda Husky about pt; RROB asked to be called and updated on the plan of care after talking with Dr Elonda Husky.

## 2015-08-15 NOTE — ED Notes (Signed)
Pt's first pregnancy without any complications, pt states pain started last night off and on; last office visit on Tuesday; denies vaginal discharge or bleeding

## 2015-08-15 NOTE — ED Provider Notes (Signed)
CSN: [id number]     Arrival date & time 08/15/15  1746 History   First MD Initiated Contact with Patient 08/15/15 1808     Chief Complaint  Patient presents with  . Abdominal Pain      HPI Pt was seen at 1810. Per pt, c/o gradual onset and persistence of multiple intermittent episodes of "abd tightness" since this morning. Has been associated with LBP since last night and "not feeling the baby move as much." Denies LOF, no vaginal bleeding/discharge, no fevers, no dysuria, no N/V/D. Hx G1P0, EDC 10/03/15 with EGA [redacted] weeks.  [facility] Past Medical History  Diagnosis Date  . Medical history non-contributory    Past Surgical History  Procedure Laterality Date  . Breast biopsy Right 09/30/2014    Procedure: BREAST BIOPSY;  Surgeon: Jamesetta So, MD;  Location: [facility];  Service: General;  Laterality: Right;   Family History  Problem Relation Age of Onset  . Cancer Maternal Grandmother     throat   Social History  Substance Use Topics  . Smoking status: Never Smoker   . Smokeless tobacco: Never Used  . Alcohol Use: No   OB History    Gravida Para Term Preterm AB TAB SAB Ectopic Multiple Living   1              Review of Systems ROS: Statement: All systems negative except as marked or noted in the HPI; Constitutional: Negative for fever and chills. ; ; Eyes: Negative for eye pain, redness and discharge. ; ; ENMT: Negative for ear pain, hoarseness, nasal congestion, sinus pressure and sore throat. ; ; Cardiovascular: Negative for chest pain, palpitations, diaphoresis, dyspnea and peripheral edema. ; ; Respiratory: Negative for cough, wheezing and stridor. ; ; Gastrointestinal: Negative for nausea, vomiting, diarrhea, abdominal pain, blood in stool, hematemesis, jaundice and rectal bleeding. . ; ; Genitourinary: Negative for dysuria, flank pain and hematuria. ; ; GYN:  +abd "tightness." No LOF, no vaginal bleeding, no vaginal discharge, no vulvar pain. ;; Musculoskeletal: +LBP.  Negative for neck pain. Negative for swelling and trauma.; ; Skin: Negative for pruritus, rash, abrasions, blisters, bruising and skin lesion.; ; Neuro: Negative for headache, lightheadedness and neck stiffness. Negative for weakness, altered level of consciousness , altered mental status, extremity weakness, paresthesias, involuntary movement, seizure and syncope.      Allergies  Review of patient's allergies indicates no known allergies.  Home Medications   Prior to Admission medications   Medication Sig Start Date End Date Taking? Authorizing Provider  Doxylamine-Pyridoxine (DICLEGIS) 10-10 MG TBEC 2 tabs q hs, if sx persist add 1 tab q am on day 3, if sx persist add 1 tab q afternoon on day 4 02/25/15  Yes Roma Schanz, CNM  ferrous sulfate 325 (65 FE) MG tablet Take 1 tablet (325 mg total) by mouth 2 (two) times daily with a meal. 07/16/15  Yes Roma Schanz, CNM  Pediatric Multiple Vit-C-FA (FLINSTONES GUMMIES OMEGA-3 DHA PO) Take by mouth. Takes 2 daily   Yes Historical Provider, MD   BP 127/74 mmHg  Pulse 65  Temp(Src) 97.7 F (36.5 C) (Oral)  Resp 18  Ht 5\' 6"  (1.676 m)  Wt 166 lb (75.297 kg)  BMI 26.81 kg/m2  SpO2 98%  LMP 12/27/2014 (Exact Date) Physical Exam  1815: Physical examination:  Nursing notes reviewed; Vital signs and O2 SAT reviewed;  Constitutional: Well developed, Well nourished, Well hydrated, In no acute distress; Head:  Normocephalic, atraumatic; Eyes:  EOMI, PERRL, No scleral icterus; ENMT: Mouth and pharynx normal, Mucous membranes moist; Neck: Supple, Full range of motion, No lymphadenopathy; Cardiovascular: Regular rate and rhythm, No gallop; Respiratory: Breath sounds clear & equal bilaterally, No wheezes.  Speaking full sentences with ease, Normal respiratory effort/excursion; Chest: Nontender, Movement normal; Abdomen: Soft, +gravid. Nontender, Nondistended, Normal bowel sounds; Genitourinary: No CVA tenderness. SVE: cervix posterior, closed. No  bleeding. ; Extremities: Pulses normal, No tenderness, No edema, No calf edema or asymmetry.; Neuro: AA&Ox3, Major CN grossly intact.  Speech clear. No gross focal motor or sensory deficits in extremities.; Skin: Color normal, Warm, Dry.   ED Course  Procedures (including critical care time) Labs Review   Imaging Review  I have personally reviewed and evaluated these images and lab results as part of my medical decision-making.   EKG Interpretation None      MDM  MDM Reviewed: previous chart, nursing note and vitals Reviewed previous: labs Interpretation: labs     Results for orders placed or performed during the hospital encounter of 08/15/15  Urinalysis, Routine w reflex microscopic  Result Value Ref Range   Color, Urine YELLOW YELLOW   APPearance CLEAR CLEAR   Specific Gravity, Urine >1.030 (H) 1.005 - 1.030   pH 6.0 5.0 - 8.0   Glucose, UA NEGATIVE NEGATIVE mg/dL   Hgb urine dipstick NEGATIVE NEGATIVE   Bilirubin Urine NEGATIVE NEGATIVE   Ketones, ur NEGATIVE NEGATIVE mg/dL   Protein, ur 30 (A) NEGATIVE mg/dL   Nitrite NEGATIVE NEGATIVE   Leukocytes, UA NEGATIVE NEGATIVE  Urine microscopic-add on  Result Value Ref Range   Squamous Epithelial / LPF 6-30 (A) NONE SEEN   WBC, UA 0-5 0 - 5 WBC/hpf   RBC / HPF 0-5 0 - 5 RBC/hpf   Bacteria, UA FEW (A) NONE SEEN    1835:   T/C to OB/GYN Dr. Elonda Husky, case discussed, including:  HPI, pertinent PM/SHx, VS/PE, dx testing, ED course and treatment:  Requests to have OB RN call him to discuss monitor tracings.   1930:  Pt has received IVF NS 1L bolus. Udip without obvious UTI, UC is pending. OB/GYN Dr. Elonda Husky has contacted ED: states monitor strip with moderate variability, no decels, no contractions; pt may be discharged. Dx and testing, as well as d/w OB/GYN, d/w pt and family.  Questions answered.  Verb understanding, agreeable to d/c home with outpt f/u.     Francine Graven, DO 08/18/15 2224

## 2015-08-15 NOTE — ED Notes (Signed)
Spoke with Scientist, research (physical sciences) at [place].  And updated about pt's status.  Katie calling Dr. Elonda Husky.

## 2015-08-18 LAB — URINE CULTURE

## 2015-08-24 ENCOUNTER — Inpatient Hospital Stay (HOSPITAL_COMMUNITY)
Admission: AD | Admit: 2015-08-24 | Discharge: 2015-08-28 | DRG: 775 | Disposition: A | Discharge status: Home / Self Care | Payer: Medicaid Other | Source: Ambulatory Visit | Attending: Family Medicine | Admitting: Family Medicine

## 2015-08-24 DIAGNOSIS — Z3400 Encounter for supervision of normal first pregnancy, unspecified trimester: Secondary | ICD-10-CM

## 2015-08-24 DIAGNOSIS — Z3A34 34 weeks gestation of pregnancy: Secondary | ICD-10-CM

## 2015-08-24 DIAGNOSIS — O1410 Severe pre-eclampsia, unspecified trimester: Secondary | ICD-10-CM

## 2015-08-24 DIAGNOSIS — O283 Abnormal ultrasonic finding on antenatal screening of mother: Secondary | ICD-10-CM | POA: Diagnosis present

## 2015-08-24 DIAGNOSIS — O1414 Severe pre-eclampsia complicating childbirth: Principal | ICD-10-CM | POA: Diagnosis present

## 2015-08-24 NOTE — MAU Note (Signed)
Pt reports her ankles and feet are really swollen for the last 2 weeks, worsening today. Also reports trouble breathing off/on.

## 2015-08-24 NOTE — MAU Provider Note (Signed)
History     CSN: [id number]  Arrival date and time: 08/24/15 2317   None     No chief complaint on file.  HPI  Joan Rose is 19 y.o. G1P0 @ 81w2dpresenting for swelling in hands and ankles. Patient has had the swelling for Rose couple of weeks. Patient noticed that her ankles were more swollen then they have been in the past. Patient states her mom called the on-call nurse and was urged by physician to come in and be evaluated tonight. Patient is currently endorsing headache. Patient denies blurry vision and RUQ pain. Patient does indicate some SOB, however states that this has been ongoing for several weeks, and is not an acute process.   +FM, - contractions, No LOF, or vaginal bleeding   OB History    Gravida Para Term Preterm AB TAB SAB Ectopic Multiple Living   1               Past Medical History  Diagnosis Date  . Medical history non-contributory     Past Surgical History  Procedure Laterality Date  . Breast biopsy Right 09/30/2014    Procedure: BREAST BIOPSY;  Surgeon: MJamesetta So MD;  Location: [facility];  Service: General;  Laterality: Right;    Family History  Problem Relation Age of Onset  . Cancer Maternal Grandmother     throat    Social History  Substance Use Topics  . Smoking status: Never Smoker   . Smokeless tobacco: Never Used  . Alcohol Use: No    Allergies: No Known Allergies  Prescriptions prior to admission  Medication Sig Dispense Refill Last Dose  . Doxylamine-Pyridoxine (DICLEGIS) 10-10 MG TBEC 2 tabs q hs, if sx persist add 1 tab q am on day 3, if sx persist add 1 tab q afternoon on day 4 100 tablet 6 08/24/2015 at Unknown time  . Pediatric Multiple Vit-C-FA (FLINSTONES GUMMIES OMEGA-3 DHA PO) Take by mouth. Takes 2 daily   08/24/2015 at Unknown time  . ferrous sulfate 325 (65 FE) MG tablet Take 1 tablet (325 mg total) by mouth 2 (two) times daily with Rose meal. 60 tablet 3 08/21/2015    Review of Systems  Respiratory: Negative for  shortness of breath.   Cardiovascular: Positive for leg swelling.  Gastrointestinal: Positive for nausea. Negative for vomiting.  Genitourinary: Negative for dysuria.  Neurological: Positive for headaches.   Physical Exam   Blood pressure 139/96, pulse 76, temperature 98.2 F (36.8 C), temperature source Oral, resp. rate 16, height 5' 6" (1.676 m), weight 173 lb (78.472 kg), last menstrual period 12/27/2014, SpO2 98 %.  Physical Exam  Constitutional: She appears well-developed and well-nourished.  Cardiovascular: Normal rate, regular rhythm and intact distal pulses.  Exam reveals no gallop.   No murmur heard. Respiratory: Effort normal and breath sounds normal. No respiratory distress. She has no wheezes.  GI: Soft. Bowel sounds are normal. There is no tenderness.  Musculoskeletal: She exhibits edema.  Bilateral ankle swelling, 1+ pitting edema in ankles, bilateral swelling of hands as well   Neurological: She displays abnormal reflex.  3+ patellar and Triceps reflex   Skin: Skin is warm and dry.  FHT:  FHR: 140 bpm, variability: moderate,  accelerations: present,  decelerations: none  Contractions on monitor: none  MAU Course  Procedures  MDM Work up for Preeclampsia  - CBC, CMET, protein/creatinine ratio  Results for Joan, Rose(MRN [id number] as of 08/25/2015 01:54  Ref. Range 08/25/2015  00:15  Sodium Latest Ref Range: 135-145 mmol/L 136  Potassium Latest Ref Range: 3.5-5.1 mmol/L 3.8  Chloride Latest Ref Range: 101-111 mmol/L 109  CO2 Latest Ref Range: 22-32 mmol/L 22  BUN Latest Ref Range: 6-20 mg/dL 12  Creatinine Latest Ref Range: 0.44-1.00 mg/dL 0.61  Calcium Latest Ref Range: 8.9-10.3 mg/dL 8.5 (L)  EGFR (Non-African Amer.) Latest Ref Range: >60 mL/min >60  EGFR (African American) Latest Ref Range: >60 mL/min >60  Glucose Latest Ref Range: 65-99 mg/dL 100 (H)  Anion gap Latest Ref Range: 5-15  5  Alkaline Phosphatase Latest Ref Range: 38-126 U/L 79  Albumin  Latest Ref Range: 3.5-5.0 g/dL 2.7 (L)  AST Latest Ref Range: 15-41 U/L 24  ALT Latest Ref Range: 14-54 U/L 13 (L)  Total Protein Latest Ref Range: 6.5-8.1 g/dL 5.8 (L)  Total Bilirubin Latest Ref Range: 0.3-1.2 mg/dL 0.5  WBC Latest Ref Range: 4.0-10.5 K/uL 7.5  RBC Latest Ref Range: 3.87-5.11 MIL/uL 3.30 (L)  Hemoglobin Latest Ref Range: 12.0-15.0 g/dL 9.2 (L)  HCT Latest Ref Range: 36.0-46.0 % 27.7 (L)  MCV Latest Ref Range: 78.0-100.0 fL 83.9  MCH Latest Ref Range: 26.0-34.0 pg 27.9  MCHC Latest Ref Range: 30.0-36.0 g/dL 33.2  RDW Latest Ref Range: 11.5-15.5 % 15.3  Platelets Latest Ref Range: 150-400 K/uL 143 (L)    Results for Joan Rose (MRN [id number]) as of 08/25/2015 01:54  Ref. Range 08/24/2015 23:25  Total Protein, Urine Latest Units: mg/dL 1120  Protein Creatinine Ratio Latest Ref Range: 0.00-0.15 mg/mgCre 10.98 (H)  Creatinine, Urine Latest Units: mg/dL 102    Assessment and Plan  # Preeclampsia with severe features: Presentation with swelling in ankles and hands. Physical exam positive for 3+ reflexes in patellar and triceps. Patient with development of headache. Blood pressures with SBP 388 and diastolic max of 97.  Protein/Creatine ration of 10.98.  - Hydralazine 10 mg IM/IV x 2   - Admit to L&D for induction of labor  - BMZ and Mag   Joan Rose 08/24/2015, 11:57 PM

## 2015-08-25 ENCOUNTER — Encounter (HOSPITAL_COMMUNITY): Payer: Self-pay

## 2015-08-25 ENCOUNTER — Inpatient Hospital Stay (HOSPITAL_COMMUNITY): Payer: Medicaid Other | Admitting: Anesthesiology

## 2015-08-25 DIAGNOSIS — Z3A34 34 weeks gestation of pregnancy: Secondary | ICD-10-CM

## 2015-08-25 DIAGNOSIS — O1410 Severe pre-eclampsia, unspecified trimester: Secondary | ICD-10-CM | POA: Diagnosis present

## 2015-08-25 DIAGNOSIS — O1414 Severe pre-eclampsia complicating childbirth: Secondary | ICD-10-CM | POA: Diagnosis not present

## 2015-08-25 LAB — COMPREHENSIVE METABOLIC PANEL
ALBUMIN: 2.7 g/dL — AB (ref 3.5–5.0)
ALT: 13 U/L — ABNORMAL LOW (ref 14–54)
ANION GAP: 5 (ref 5–15)
AST: 24 U/L (ref 15–41)
Alkaline Phosphatase: 79 U/L (ref 38–126)
BILIRUBIN TOTAL: 0.5 mg/dL (ref 0.3–1.2)
BUN: 12 mg/dL (ref 6–20)
CHLORIDE: 109 mmol/L (ref 101–111)
CO2: 22 mmol/L (ref 22–32)
Calcium: 8.5 mg/dL — ABNORMAL LOW (ref 8.9–10.3)
Creatinine, Ser: 0.61 mg/dL (ref 0.44–1.00)
GFR calc Af Amer: >60 mL/min (ref >60)
Glucose, Bld: 100 mg/dL — ABNORMAL HIGH (ref 65–99)
POTASSIUM: 3.8 mmol/L (ref 3.5–5.1)
Sodium: 136 mmol/L (ref 135–145)
TOTAL PROTEIN: 5.8 g/dL — AB (ref 6.5–8.1)

## 2015-08-25 LAB — URINALYSIS, ROUTINE W REFLEX MICROSCOPIC
BILIRUBIN URINE: NEGATIVE
Glucose, UA: NEGATIVE mg/dL
KETONES UR: NEGATIVE mg/dL
LEUKOCYTES UA: NEGATIVE
NITRITE: NEGATIVE
PH: 7 (ref 5.0–8.0)
Specific Gravity, Urine: 1.02 (ref 1.005–1.030)

## 2015-08-25 LAB — CBC
HCT: 32.8 % — ABNORMAL LOW (ref 36.0–46.0)
HEMATOCRIT: 27.7 % — AB (ref 36.0–46.0)
HEMATOCRIT: 30.1 % — AB (ref 36.0–46.0)
HEMOGLOBIN: 9.2 g/dL — AB (ref 12.0–15.0)
HEMOGLOBIN: 9.9 g/dL — AB (ref 12.0–15.0)
Hemoglobin: 10.9 g/dL — ABNORMAL LOW (ref 12.0–15.0)
MCH: 27.6 pg (ref 26.0–34.0)
MCH: 27.7 pg (ref 26.0–34.0)
MCH: 27.9 pg (ref 26.0–34.0)
MCHC: 32.9 g/dL (ref 30.0–36.0)
MCHC: 33.2 g/dL (ref 30.0–36.0)
MCHC: 33.2 g/dL (ref 30.0–36.0)
MCV: 83 fL (ref 78.0–100.0)
MCV: 83.9 fL (ref 78.0–100.0)
MCV: 84.1 fL (ref 78.0–100.0)
PLATELETS: 169 10*3/uL (ref 150–400)
Platelets: 143 10*3/uL — ABNORMAL LOW (ref 150–400)
Platelets: 156 10*3/uL (ref 150–400)
RBC: 3.3 MIL/uL — ABNORMAL LOW (ref 3.87–5.11)
RBC: 3.58 MIL/uL — ABNORMAL LOW (ref 3.87–5.11)
RBC: 3.95 MIL/uL (ref 3.87–5.11)
RDW: 15.3 % (ref 11.5–15.5)
RDW: 15.3 % (ref 11.5–15.5)
RDW: 15.3 % (ref 11.5–15.5)
WBC: 12.8 10*3/uL — AB (ref 4.0–10.5)
WBC: 14.9 10*3/uL — ABNORMAL HIGH (ref 4.0–10.5)
WBC: 7.5 10*3/uL (ref 4.0–10.5)

## 2015-08-25 LAB — GROUP B STREP BY PCR: GROUP B STREP BY PCR: NEGATIVE

## 2015-08-25 LAB — RPR: RPR: NONREACTIVE

## 2015-08-25 LAB — PROTEIN / CREATININE RATIO, URINE
Creatinine, Urine: 102 mg/dL
Protein Creatinine Ratio: 10.98 mg/mg{Cre} — ABNORMAL HIGH (ref 0.00–0.15)
Total Protein, Urine: 1120 mg/dL

## 2015-08-25 LAB — URINE MICROSCOPIC-ADD ON

## 2015-08-25 LAB — TYPE AND SCREEN
ABO/RH(D): A POS
ANTIBODY SCREEN: NEGATIVE

## 2015-08-25 LAB — ABO/RH: ABO/RH(D): A POS

## 2015-08-25 LAB — OB RESULTS CONSOLE GBS: STREP GROUP B AG: NEGATIVE

## 2015-08-25 MED ORDER — CITRIC ACID-SODIUM CITRATE 334-500 MG/5ML PO SOLN: 30.0000 mL | ORAL | Status: DC | PRN

## 2015-08-25 MED ORDER — BETAMETHASONE SOD PHOS & ACET 6 (3-3) MG/ML IJ SUSP
12.0000 mg | Freq: Once | INTRAMUSCULAR | Status: DC
  Filled 2015-08-25: qty 2

## 2015-08-25 MED ORDER — BENZOCAINE-MENTHOL 20-0.5 % EX AERO
1.0000 "application " | INHALATION_SPRAY | CUTANEOUS | Status: DC | PRN
  Filled 2015-08-25: qty 56

## 2015-08-25 MED ORDER — PRENATAL MULTIVITAMIN CH
1.0000 | ORAL_TABLET | Freq: Every day | ORAL | Status: DC
  Administered 2015-08-26 – 2015-08-28 (×3): 1 via ORAL
  Filled 2015-08-25 (×3): qty 1

## 2015-08-25 MED ORDER — MAGNESIUM SULFATE BOLUS VIA INFUSION
4.0000 g | Freq: Once | INTRAVENOUS | Status: AC
  Administered 2015-08-25: 4 g via INTRAVENOUS
  Filled 2015-08-25: qty 500

## 2015-08-25 MED ORDER — DIPHENHYDRAMINE HCL 50 MG/ML IJ SOLN: 12.5000 mg | INTRAMUSCULAR | Status: DC | PRN

## 2015-08-25 MED ORDER — HYDRALAZINE HCL 20 MG/ML IJ SOLN
10.0000 mg | Freq: Once | INTRAMUSCULAR | Status: AC
  Administered 2015-08-25: 10 mg via INTRAMUSCULAR

## 2015-08-25 MED ORDER — HYDRALAZINE HCL 20 MG/ML IJ SOLN
10.0000 mg | Freq: Once | INTRAMUSCULAR | Status: AC
  Administered 2015-08-25: 10 mg via INTRAVENOUS
  Filled 2015-08-25: qty 1

## 2015-08-25 MED ORDER — SIMETHICONE 80 MG PO CHEW: 80.0000 mg | CHEWABLE_TABLET | ORAL | Status: DC | PRN

## 2015-08-25 MED ORDER — LIDOCAINE HCL (PF) 1 % IJ SOLN
30.0000 mL | INTRAMUSCULAR | Status: DC | PRN
  Filled 2015-08-25: qty 30

## 2015-08-25 MED ORDER — SENNOSIDES-DOCUSATE SODIUM 8.6-50 MG PO TABS
2.0000 | ORAL_TABLET | ORAL | Status: DC
  Administered 2015-08-26 – 2015-08-27 (×3): 2 via ORAL
  Filled 2015-08-25 (×3): qty 2

## 2015-08-25 MED ORDER — LACTATED RINGERS IV SOLN
INTRAVENOUS | Status: DC
  Administered 2015-08-25 (×3): via INTRAVENOUS

## 2015-08-25 MED ORDER — FENTANYL 2.5 MCG/ML BUPIVACAINE 1/10 % EPIDURAL INFUSION (WH - ANES)
14.0000 mL/h | INTRAMUSCULAR | Status: DC | PRN
  Administered 2015-08-25 (×2): 14 mL/h via EPIDURAL
  Filled 2015-08-25: qty 125

## 2015-08-25 MED ORDER — HYDRALAZINE HCL 20 MG/ML IJ SOLN: 10.0000 mg | Freq: Once | INTRAMUSCULAR | Status: DC | PRN

## 2015-08-25 MED ORDER — TETANUS-DIPHTH-ACELL PERTUSSIS 5-2.5-18.5 LF-MCG/0.5 IM SUSP
0.5000 mL | Freq: Once | INTRAMUSCULAR | Status: AC
  Administered 2015-08-26: 0.5 mL via INTRAMUSCULAR
  Filled 2015-08-25 (×2): qty 0.5

## 2015-08-25 MED ORDER — OXYTOCIN BOLUS FROM INFUSION: 500.0000 mL | INTRAVENOUS | Status: DC

## 2015-08-25 MED ORDER — OXYTOCIN 40 UNITS IN LACTATED RINGERS INFUSION - SIMPLE MED
62.5000 mL/h | INTRAVENOUS | Status: DC
  Administered 2015-08-25: 62.5 mL/h via INTRAVENOUS
  Filled 2015-08-25: qty 1000

## 2015-08-25 MED ORDER — PHENYLEPHRINE 40 MCG/ML (10ML) SYRINGE FOR IV PUSH (FOR BLOOD PRESSURE SUPPORT)
80.0000 ug | PREFILLED_SYRINGE | INTRAVENOUS | Status: DC | PRN
  Filled 2015-08-25: qty 2
  Filled 2015-08-25: qty 20

## 2015-08-25 MED ORDER — EPHEDRINE 5 MG/ML INJ
10.0000 mg | INTRAVENOUS | Status: DC | PRN
  Filled 2015-08-25: qty 2

## 2015-08-25 MED ORDER — WITCH HAZEL-GLYCERIN EX PADS: 1.0000 "application " | MEDICATED_PAD | CUTANEOUS | Status: DC | PRN

## 2015-08-25 MED ORDER — OXYCODONE-ACETAMINOPHEN 5-325 MG PO TABS: 1.0000 | ORAL_TABLET | ORAL | Status: DC | PRN

## 2015-08-25 MED ORDER — DIPHENHYDRAMINE HCL 25 MG PO CAPS: 25.0000 mg | ORAL_CAPSULE | Freq: Four times a day (QID) | ORAL | Status: DC | PRN

## 2015-08-25 MED ORDER — MAGNESIUM SULFATE 50 % IJ SOLN
2.0000 g/h | INTRAVENOUS | Status: AC
  Administered 2015-08-25 – 2015-08-26 (×2): 2 g/h via INTRAVENOUS
  Filled 2015-08-25 (×3): qty 80

## 2015-08-25 MED ORDER — ONDANSETRON HCL 4 MG PO TABS: 4.0000 mg | ORAL_TABLET | ORAL | Status: DC | PRN

## 2015-08-25 MED ORDER — OXYCODONE-ACETAMINOPHEN 5-325 MG PO TABS: 2.0000 | ORAL_TABLET | ORAL | Status: DC | PRN

## 2015-08-25 MED ORDER — ACETAMINOPHEN 325 MG PO TABS: 650.0000 mg | ORAL_TABLET | ORAL | Status: DC | PRN

## 2015-08-25 MED ORDER — FENTANYL CITRATE (PF) 100 MCG/2ML IJ SOLN
100.0000 ug | INTRAMUSCULAR | Status: DC | PRN
  Administered 2015-08-25 (×2): 100 ug via INTRAVENOUS
  Filled 2015-08-25 (×2): qty 2

## 2015-08-25 MED ORDER — BETAMETHASONE SOD PHOS & ACET 6 (3-3) MG/ML IJ SUSP
12.0000 mg | Freq: Once | INTRAMUSCULAR | Status: AC
  Administered 2015-08-25: 12 mg via INTRAMUSCULAR
  Filled 2015-08-25: qty 2

## 2015-08-25 MED ORDER — ACETAMINOPHEN 325 MG PO TABS: 650.0000 mg | ORAL_TABLET | Freq: Four times a day (QID) | ORAL | Status: DC | PRN

## 2015-08-25 MED ORDER — ONDANSETRON HCL 4 MG/2ML IJ SOLN
4.0000 mg | Freq: Four times a day (QID) | INTRAMUSCULAR | Status: DC | PRN
  Administered 2015-08-25: 4 mg via INTRAVENOUS
  Filled 2015-08-25: qty 2

## 2015-08-25 MED ORDER — LANOLIN HYDROUS EX OINT: TOPICAL_OINTMENT | CUTANEOUS | Status: DC | PRN

## 2015-08-25 MED ORDER — DIBUCAINE 1 % RE OINT: 1.0000 "application " | TOPICAL_OINTMENT | RECTAL | Status: DC | PRN

## 2015-08-25 MED ORDER — LACTATED RINGERS IV SOLN
500.0000 mL | INTRAVENOUS | Status: DC | PRN
  Administered 2015-08-25 (×2): 500 mL via INTRAVENOUS

## 2015-08-25 MED ORDER — MAGNESIUM SULFATE 4 GM/100ML IV SOLN
4.0000 g | Freq: Once | INTRAVENOUS | Status: DC
Start: 2015-08-25 — End: 2015-08-25

## 2015-08-25 MED ORDER — ONDANSETRON HCL 4 MG/2ML IJ SOLN: 4.0000 mg | INTRAMUSCULAR | Status: DC | PRN

## 2015-08-25 MED ORDER — PENICILLIN G POTASSIUM 5000000 UNITS IJ SOLR
2.5000 10*6.[IU] | INTRAVENOUS | Status: DC
  Administered 2015-08-25 (×3): 2.5 10*6.[IU] via INTRAVENOUS
  Filled 2015-08-25 (×6): qty 2.5

## 2015-08-25 MED ORDER — MISOPROSTOL 25 MCG QUARTER TABLET
25.0000 ug | ORAL_TABLET | ORAL | Status: DC | PRN
  Administered 2015-08-25 (×3): 25 ug via VAGINAL
  Filled 2015-08-25: qty 0.25
  Filled 2015-08-25: qty 1
  Filled 2015-08-25 (×2): qty 0.25

## 2015-08-25 MED ORDER — LABETALOL HCL 5 MG/ML IV SOLN: 20.0000 mg | INTRAVENOUS | Status: DC | PRN

## 2015-08-25 MED ORDER — IBUPROFEN 600 MG PO TABS
600.0000 mg | ORAL_TABLET | Freq: Four times a day (QID) | ORAL | Status: DC
  Administered 2015-08-26 – 2015-08-28 (×10): 600 mg via ORAL
  Filled 2015-08-25 (×12): qty 1

## 2015-08-25 MED ORDER — PENICILLIN G POTASSIUM 5000000 UNITS IJ SOLR
5.0000 10*6.[IU] | Freq: Once | INTRAVENOUS | Status: AC
  Administered 2015-08-25: 5 10*6.[IU] via INTRAVENOUS
  Filled 2015-08-25: qty 5

## 2015-08-25 MED ORDER — ZOLPIDEM TARTRATE 5 MG PO TABS: 5.0000 mg | ORAL_TABLET | Freq: Every evening | ORAL | Status: DC | PRN

## 2015-08-25 MED ORDER — MAGNESIUM SULFATE 4 GM/100ML IV SOLN: 4.0000 g | Freq: Once | INTRAVENOUS | Status: DC

## 2015-08-25 MED ORDER — TERBUTALINE SULFATE 1 MG/ML IJ SOLN: 0.2500 mg | Freq: Once | INTRAMUSCULAR | Status: DC | PRN

## 2015-08-25 MED ORDER — LIDOCAINE HCL (PF) 1 % IJ SOLN
INTRAMUSCULAR | Status: DC | PRN
  Administered 2015-08-25 (×2): 4 mL

## 2015-08-25 NOTE — Progress Notes (Addendum)
Labor Progress Note Joan Rose is a 19 y.o. G1P0 at [redacted]w[redacted]d presented with severe pre-eclampsia and is now undergoing IOL and continuous Mag infusion. Cytotec x 2 and foley bulb placed.   S: Foley bulb is still in place. Patient was feeling significant discomfort from the foley bulb, but this was relieved by fentanyl IV. BPs have been well controlled. She has only required hydralazine once. No HA, no RUQ pain. Still with swelling in her hands and face. No vision changes.   O:  BP 129/82 mmHg  Pulse 73  Temp(Src) 97.7 F (36.5 C) (Oral)  Resp 18  Ht 5\' 6"  (1.676 m)  Wt 78.472 kg (173 lb)  BMI 27.94 kg/m2  SpO2 98%  LMP 12/27/2014 (Exact Date) EFM: 120 baseline, minimal variability secondary to magnesium. Cat 2 strip.  Contractions q6-8 minutes.   CVE: Dilation: 1 Effacement (%): 60 Cervical Position: Posterior Station: -3 Presentation: Vertex Exam by:: Dr. Delena Bali   A&P: 19 y.o. G1P0 [redacted]w[redacted]d undergoing IOL for severe pre-eclampsia.  #Labor: Continue cytotec (cytotec #3 placed) and foley bulb until foley bulb comes out, then initiate pitocin.  #Pain: IV pain medicine for now until she decides to get an epidural.  #FWB: Category 2 secondary to Mag infusion #GBS unknown: Continue PCN, now adequately treated.   Stormy Card, MD 1:08 PM

## 2015-08-25 NOTE — Progress Notes (Signed)
EFM removed to go to bathroom, FHR tracing category 1 prior to removal

## 2015-08-25 NOTE — Progress Notes (Signed)
Patient ID: [name], female   DOB: [birth year], 19 y.o.   MRN: [id number] Labor Progress Note Taneah A Laufer is a 19 y.o. G1P0 at [redacted]w[redacted]d presented for symptoms concerning for severe pre-eclampsia. She is now being induced with cytotec.  S: Patient has had one dose of cytotec and is feeling some mild intermittent contractions.   O:  BP 130/78 mmHg  Pulse 74  Temp(Src) 98.2 F (36.8 C) (Oral)  Resp 16  Ht 5\' 6"  (1.676 m)  Wt 78.472 kg (173 lb)  BMI 27.94 kg/m2  SpO2 98%  LMP 12/27/2014 (Exact Date) EFM: 140, reassuring, Cat 1  CVE: Dilation: 1 Effacement (%): 60 Cervical Position: Posterior Station: -3 Presentation: Vertex Exam by:: Dr. Delena Bali   A&P: 18 y.o. G1P0 [redacted]w[redacted]d undergoing IOL for severe pre-eclampsia.  #Labor: Foley bulb placed and a second dose of cytotec given intravaginally.  #Pain: No need at this time.  #FWB: Reassuring #GBS PCR negative, however, without culture, will continue PCN 2.71milU q4.   Stormy Card, MD 9:14 AM

## 2015-08-25 NOTE — Progress Notes (Signed)
Pt up to bathroom, EFM removed, FHR tracing category 1 before removal of EFM

## 2015-08-25 NOTE — Consult Note (Signed)
Neonatology Note:   Attendance at Delivery:    I was asked by Dr. Delena Bali to attend this NSVD at 34 3/7 weeks after induction of labor for severe pre-eclampsia. The mother is a G1P0 A pos, GBS neg with onset of BP elevation today. She was treated with Hydralazine and Magnesium sulfate. She got 4 doses of Pen G (given before GBS status was known) and 1 dose of Betamethasone. ROM at delivery delivery, fluid clear. Infant was a little floppy, blue, but with good HR at birth and some respiratory effort. After bulb suctioning, she cried for about 1 minute, then became apneic, with HR dropping to the 60s. I applied PPV with the neopuff. She required PPV for 3 minutes, responding with increasing HR and improvement in color. We placed a pulse oximeter and titrated FIO2 to keep her O2 saturations within desired parameters. At about 4.5 minutes, she was breathing regularly with CPAP alone, but became apneic without the mask. Ap 7/7. She was shown to her parents briefly, then was transported to the NICU on CPAP, with her father in attendance.   Real Cons, MD

## 2015-08-25 NOTE — Progress Notes (Signed)
Labor Progress Note Joan Rose is a 19 y.o. G1P0 at [redacted]w[redacted]d presented for IOL secondary to severe pre-eclampsia. She has had cytotec x 3 as well as foley bulb, which has since come out. She is currently laboring on her own and making cervical change. She has also had an epidural placed.   S:  Feeling better after the epidural. Feels her contractions.   O:  BP 138/85 mmHg  Pulse 82  Temp(Src) 98.1 F (36.7 C) (Oral)  Resp 18  Ht 5\' 6"  (1.676 m)  Wt 78.472 kg (173 lb)  BMI 27.94 kg/m2  SpO2 98%  LMP 12/27/2014 (Exact Date) EFM: Category 2 - minimal variability, 145 baseline.  Ctx q3-86min  CVE: Dilation: 5.5 Effacement (%): 80 Cervical Position: Posterior Station: -2 Presentation: Vertex Exam by:: Carollee Massed RN    A&P: 19 y.o. G1P0 [redacted]w[redacted]d undergoing IOL for severe pre-eclampsia.  #Labor: Plan for possible AROM and/or pitocin should her cervical change slow down or contractions start to space out. Continue Mag infusion as well as PCN q4 for unknown GBS. BPs have looked well controlled. Hydralazine PRN for BP>150/90. She is due for second BMZ at 3AM.  #Pain: Epidural in place #FWB: Cat 2 secondary to Mag infusion #GBS unknown, currently on PCN q4. Has been adequately treated.   Stormy Card, MD 7:29 PM

## 2015-08-25 NOTE — Anesthesia Procedure Notes (Signed)
Epidural Patient location during procedure: OB  Staffing Anesthesiologist: Deairra Halleck Performed by: anesthesiologist   Preanesthetic Checklist Completed: patient identified, site marked, surgical consent, pre-op evaluation, timeout performed, IV checked, risks and benefits discussed and monitors and equipment checked  Epidural Patient position: sitting Prep: site prepped and draped and DuraPrep Patient monitoring: continuous pulse ox and blood pressure Approach: midline Location: L3-L4 Injection technique: LOR saline  Needle:  Needle type: Tuohy  Needle gauge: 17 G Needle length: 9 cm and 9 Needle insertion depth: 5 cm cm Catheter type: closed end flexible Catheter size: 19 Gauge Catheter at skin depth: 10 cm Test dose: negative  Assessment Events: blood not aspirated, injection not painful, no injection resistance, negative IV test and no paresthesia  Additional Notes Patient identified. Risks/Benefits/Options discussed with patient including but not limited to bleeding, infection, nerve damage, paralysis, failed block, incomplete pain control, headache, blood pressure changes, nausea, vomiting, reactions to medication both or allergic, itching and postpartum back pain. Confirmed with bedside nurse the patient's most recent platelet count. Confirmed with patient that they are not currently taking any anticoagulation, have any bleeding history or any family history of bleeding disorders. Patient expressed understanding and wished to proceed. All questions were answered. Sterile technique was used throughout the entire procedure. Please see nursing notes for vital signs. Test dose was given through epidural catheter and negative prior to continuing to dose epidural or start infusion. Warning signs of high block given to the patient including shortness of breath, tingling/numbness in hands, complete motor block, or any concerning symptoms with instructions to call for help. Patient was  given instructions on fall risk and not to get out of bed. All questions and concerns addressed with instructions to call with any issues or inadequate analgesia.      

## 2015-08-25 NOTE — Anesthesia Preprocedure Evaluation (Signed)
Anesthesia Evaluation  Patient identified by MRN, date of birth, ID band Patient awake    Reviewed: Allergy & Precautions, NPO status , Patient's Chart, lab work & pertinent test results  History of Anesthesia Complications Negative for: history of anesthetic complications  Airway Mallampati: II  TM Distance: >3 FB Neck ROM: Full    Dental no notable dental hx. (+) Dental Advisory Given   Pulmonary neg pulmonary ROS,    Pulmonary exam normal breath sounds clear to auscultation       Cardiovascular hypertension, Normal cardiovascular exam Rhythm:Regular Rate:Normal     Neuro/Psych negative neurological ROS  negative psych ROS   GI/Hepatic negative GI ROS, Neg liver ROS,   Endo/Other  negative endocrine ROS  Renal/GU negative Renal ROS  negative genitourinary   Musculoskeletal negative musculoskeletal ROS (+)   Abdominal   Peds negative pediatric ROS (+)  Hematology negative hematology ROS (+)   Anesthesia Other Findings   Reproductive/Obstetrics (+) Pregnancy                             Anesthesia Physical Anesthesia Plan  ASA: II  Anesthesia Plan: Epidural   Post-op Pain Management:    Induction:   Airway Management Planned:   Additional Equipment:   Intra-op Plan:   Post-operative Plan:   Informed Consent: I have reviewed the patients History and Physical, chart, labs and discussed the procedure including the risks, benefits and alternatives for the proposed anesthesia with the patient or authorized representative who has indicated his/her understanding and acceptance.   Dental advisory given  Plan Discussed with: CRNA  Anesthesia Plan Comments:         Anesthesia Quick Evaluation

## 2015-08-25 NOTE — H&P (Signed)
LABOR ADMISSION HISTORY AND PHYSICAL  Joan Rose is a 19 y.o. female G1P0 with IUP at [redacted]w[redacted]d presenting for severe preeclampsia. Presentation to MAU with swelling in ankles and hands. Physical exam positive for 3+ reflexes in patellar and triceps. Patient with development of headache. Blood pressures with SBP [id number] and diastolic max of 97. Protein/Creatine ration of 10.98.  She reports +FM, no contractions, No LOF, no VB, no blurry vision or RUQ pain.  She plans on breast feeding. She request undecided for birth control.  Dating: By LMP & 8 wk u/s --->  Estimated Date of Delivery: 10/03/15  Sono:    @[redacted]w[redacted]d , CWD, LV EICF, otherwise normal anatomy, cephalic presentation, Q000111Q g, 48.1% EFW   Prenatal History/Complications:  [facility]  Initiated Care at  8.4wks  FOB [facility] Asper, 55yo, 1st baby, dating  Dating By LMP c/w 8wk u/s  Pap <21yo  GC/CT Initial: -/+  POC: -/-               36+wks:  Genetic Screen NT/IT:  neg  CF screen neg  Anatomic [place] Female, normal other than Lt EICF   Repeat @ 24wks: still present, mild caliectasis 25mm  Flu vaccine Recommended  Tdap Recommended ~ 28wks  Glucose Screen  2 hr normal: 66/124/103  GBS   Feed Preference breast  Contraception Undecided, discussed  Circumcision n/a  Childbirth Classes Interested, info given  Pediatrician Undecided, info given     Past Medical History: Past Medical History  Diagnosis Date  . Medical history non-contributory     Past Surgical History: Past Surgical History  Procedure Laterality Date  . Breast biopsy Right 09/30/2014    Procedure: BREAST BIOPSY;  Surgeon: Jamesetta So, MD;  Location: [facility];  Service: General;  Laterality: Right;    Obstetrical History: OB History    Gravida Para Term Preterm AB TAB SAB Ectopic Multiple Living   1               Social History: Social History   Social History  . Marital Status: Single    Spouse Name: N/A  . Number of Children: N/A  . Years  of Education: N/A   Social History Main Topics  . Smoking status: Never Smoker   . Smokeless tobacco: Never Used  . Alcohol Use: No  . Drug Use: No  . Sexual Activity: Yes    Birth Control/ Protection: None   Other Topics Concern  . Not on file   Social History Narrative    Family History: Family History  Problem Relation Age of Onset  . Cancer Maternal Grandmother     throat    Allergies: No Known Allergies  Prescriptions prior to admission  Medication Sig Dispense Refill Last Dose  . Doxylamine-Pyridoxine (DICLEGIS) 10-10 MG TBEC 2 tabs q hs, if sx persist add 1 tab q am on day 3, if sx persist add 1 tab q afternoon on day 4 100 tablet 6 08/24/2015 at Unknown time  . Pediatric Multiple Vit-C-FA (FLINSTONES GUMMIES OMEGA-3 DHA PO) Take by mouth. Takes 2 daily   08/24/2015 at Unknown time  . ferrous sulfate 325 (65 FE) MG tablet Take 1 tablet (325 mg total) by mouth 2 (two) times daily with a meal. 60 tablet 3 08/21/2015     Review of Systems   All systems reviewed and negative except as stated in HPI  BP 159/92 mmHg  Pulse 95  Temp(Src) 98.2 F (36.8 C) (Oral)  Resp 16  Ht 5\' 6"  (1.676 m)  Wt 173 lb (78.472 kg)  BMI 27.94 kg/m2  SpO2 98%  LMP 12/27/2014 (Exact Date) General appearance: alert and cooperative Lungs: clear to auscultation bilaterally Heart: regular rate and rhythm Abdomen: soft, non-tender; bowel sounds normal Extremities: Homans sign is negative, no sign of DVT, edema DTR's 3+ reflex in patellar and triceps  Presentation: cephalic Fetal monitoringBaseline: 130 bpm, Variability: Good {> 6 bpm), Accelerations: Reactive and Decelerations: Absent Uterine activity None    Prenatal labs: ABO, Rh: A/Positive/-- (06/14 1527) Antibody: Negative (11/01 0900) Rubella: !Error! RPR: Non Reactive (11/01 0900)  HBsAg: Negative (06/14 1527)  HIV: Non Reactive (11/01 0900)  GBS:    1 hr Glucola 124  Genetic screening  Negative  Anatomy US Female,  normal other than LV EICF   Repeat @ 24wks: still present, mild caliectasis 94mm, no longer present at 28 w u/s  Prenatal Transfer Tool  Maternal Diabetes: No Genetic Screening: Normal Maternal Ultrasounds/Referrals: Abnormal:  Findings:   Isolated EIF (echogenic intracardiac focus) Fetal Ultrasounds or other Referrals:  None Maternal Substance Abuse:  No Significant Maternal Medications:  None Significant Maternal Lab Results: None  Results for orders placed or performed during the hospital encounter of 08/24/15 (from the past 24 hour(s))  Urinalysis, Routine w reflex microscopic (not at [facility])   Collection Time: 08/24/15 11:25 PM  Result Value Ref Range   Color, Urine YELLOW YELLOW   APPearance CLEAR CLEAR   Specific Gravity, Urine 1.020 1.005 - 1.030   pH 7.0 5.0 - 8.0   Glucose, UA NEGATIVE NEGATIVE mg/dL   Hgb urine dipstick SMALL (A) NEGATIVE   Bilirubin Urine NEGATIVE NEGATIVE   Ketones, ur NEGATIVE NEGATIVE mg/dL   Protein, ur >300 (A) NEGATIVE mg/dL   Nitrite NEGATIVE NEGATIVE   Leukocytes, UA NEGATIVE NEGATIVE  Protein / creatinine ratio, urine   Collection Time: 08/24/15 11:25 PM  Result Value Ref Range   Creatinine, Urine 102 mg/dL   Total Protein, Urine 1120 mg/dL   Protein Creatinine Ratio 10.98 (H) 0.00 - 0.15 mg/mg[Cre]  Urine microscopic-add on   Collection Time: 08/24/15 11:25 PM  Result Value Ref Range   Squamous Epithelial / LPF 0-5 (A) NONE SEEN   WBC, UA 0-5 0 - 5 WBC/hpf   RBC / HPF 0-5 0 - 5 RBC/hpf   Bacteria, UA FEW (A) NONE SEEN  Comprehensive metabolic panel   Collection Time: 08/25/15 12:15 AM  Result Value Ref Range   Sodium 136 135 - 145 mmol/L   Potassium 3.8 3.5 - 5.1 mmol/L   Chloride 109 101 - 111 mmol/L   CO2 22 22 - 32 mmol/L   Glucose, Bld 100 (H) 65 - 99 mg/dL   BUN 12 6 - 20 mg/dL   Creatinine, Ser 0.61 0.44 - 1.00 mg/dL   Calcium 8.5 (L) 8.9 - 10.3 mg/dL   Total Protein 5.8 (L) 6.5 - 8.1 g/dL   Albumin 2.7 (L) 3.5 - 5.0 g/dL    AST 24 15 - 41 U/L   ALT 13 (L) 14 - 54 U/L   Alkaline Phosphatase 79 38 - 126 U/L   Total Bilirubin 0.5 0.3 - 1.2 mg/dL   GFR calc non Af Amer >60 >60 mL/min   GFR calc Af Amer >60 >60 mL/min   Anion gap 5 5 - 15  CBC   Collection Time: 08/25/15 12:15 AM  Result Value Ref Range   WBC 7.5 4.0 - 10.5 K/uL   RBC 3.30 (L)  3.87 - 5.11 MIL/uL   Hemoglobin 9.2 (L) 12.0 - 15.0 g/dL   HCT 27.7 (L) 36.0 - 46.0 %   MCV 83.9 78.0 - 100.0 fL   MCH 27.9 26.0 - 34.0 pg   MCHC 33.2 30.0 - 36.0 g/dL   RDW 15.3 11.5 - 15.5 %   Platelets 143 (L) 150 - 400 K/uL    Patient Active Problem List   Diagnosis Date Noted  . Severe preeclampsia 08/25/2015  . Fetal echogenic intracardiac focus on prenatal ultrasound 06/24/2015  . Left ovarian cyst 03/25/2015  . Chlamydia infection affecting pregnancy in first trimester, antepartum 03/03/2015  . Supervision of normal first pregnancy 02/25/2015  . Irregular menses 06/07/2013    Assessment: Joan Rose is a 19 y.o. G1P0 at [redacted]w[redacted]d here for IOL due preeclampsia with severe features   #Labor:Cytotec  #Pain: Epidural  #FWB: Category  #ID:  GBS unknown,  Will get GBS pcr, start pencillin G  #MOF: Breast #MOC: Undecided  #Circ:  N/a   Kerrin Mo, MD

## 2015-08-26 ENCOUNTER — Encounter (HOSPITAL_COMMUNITY): Payer: Self-pay

## 2015-08-26 ENCOUNTER — Encounter: Payer: Medicaid Other | Admitting: Women's Health

## 2015-08-26 MED ORDER — LACTATED RINGERS IV SOLN
INTRAVENOUS | Status: DC
  Administered 2015-08-26 (×2): via INTRAVENOUS

## 2015-08-26 NOTE — Lactation Note (Signed)
This note was copied from the chart of Joan Brewing technologist. Lactation Consultation Note  Patient Name: Joan Rose [id number] Date: 08/26/2015 Reason for consult: Initial assessment;NICU baby;Infant < 6lbs;Late preterm infant, now 26 hours old and 34 4/7 weeks CGA. Mom has been pumping but not expressing any colostrum. I showed mom how to hand express, and she return demonstrated with good technique. She was able to collect about 0.5-1 ml of colostrum to bring to her baby. Mom knows to pump every 2-3 hours. Mom is active with [place] [facility] [facility], and fax was sent. Mom knows to call for questions/concerns. Lactation services also reviewed.    Maternal Data Formula Feeding for Exclusion: Yes (baby in NICU, mom was on magnesium drip) Reason for exclusion: Admission to Intensive Care Unit (ICU) post-partum Has patient been taught Hand Expression?: Yes Does the patient have breastfeeding experience prior to this delivery?: No  Feeding Feeding Type: Formula Length of feed: 30 min  LATCH Score/Interventions                      Lactation Tools Discussed/Used WIC Program: Yes (fax sent to [employer] for DEP) Pump Review: Setup, frequency, and cleaning;Milk Storage;Other (comment) (hand expression, initiation setting, NICU booklet review) Initiated by:: bedside rn Date initiated:: 08/25/15   Consult Status Consult Status: Follow-up Date: 08/27/15 Follow-up type: In-patient    Tonna Corner 08/26/2015, 6:44 PM

## 2015-08-26 NOTE — Anesthesia Postprocedure Evaluation (Signed)
Anesthesia Post Note  Patient: Joan Rose  Procedure(s) Performed: * No procedures listed *  Patient location during evaluation: Antenatal Anesthesia Type: Epidural Level of consciousness: awake and alert Pain management: satisfactory to patient Vital Signs Assessment: post-procedure vital signs reviewed and stable Respiratory status: respiratory function stable Cardiovascular status: stable Postop Assessment: no headache, no backache, epidural receding, patient able to bend at knees, no signs of nausea or vomiting and adequate PO intake Anesthetic complications: no    Last Vitals:  Filed Vitals:   08/26/15 1622 08/26/15 1736  BP: 137/95 137/87  Pulse: 78 68  Temp:    Resp: 18 20    Last Pain:  Filed Vitals:   08/26/15 1736  PainSc: 3                  Jennalynn Rivard

## 2015-08-26 NOTE — Progress Notes (Signed)
Post Partum Day 1 Subjective:  Joan Rose is a 19 y.o. G1P0101 [redacted]w[redacted]d s/p SVD after IOL for severe pre-eclampsia.  No acute events overnight.  Pt denies problems with ambulating, voiding or po intake.  She denies nausea or vomiting.  Pain is well controlled.  She has had flatus.  Lochia Minimal.  Plan for birth control is Depo-Provera.  Method of Feeding: Breast, she is trying to pump for her baby in the NICU.   Objective: Blood pressure 136/85, pulse 92, temperature 98.3 F (36.8 C), temperature source Oral, resp. rate 18, height 5\' 6"  (1.676 m), weight 78.472 kg (173 lb), last menstrual period 12/27/2014, SpO2 98 %, unknown if currently breastfeeding.  Physical Exam:  General: alert, cooperative and no distress Lochia:normal flow Chest: normal WOB Heart: Regular rate Abdomen: +BS, soft, mild TTP (appropriate) Uterine Fundus: firm, below umbillicus DVT Evaluation: No evidence of DVT seen on physical exam. Extremities: Mild edema HEENT: Facial edema improved   Recent Labs  08/25/15 1622 08/25/15 2113  HGB 10.9* 9.9*  HCT 32.8* 30.1*    Assessment/Plan:  ASSESSMENT: Joan Rose is a 19 y.o. G1P0101 [redacted]w[redacted]d s/p SVD after IOL for severe pre-eclampsia.   Plan for discharge tomorrow or the next day.   Continue Mag for at least 24 hours post partum Continue to monitor BPs. Treat for BP>150/90 Continue routine PP care Breastfeeding support PRN  LOS: 1 day   Stormy Card 08/26/2015, 7:43 AM

## 2015-08-27 NOTE — Progress Notes (Signed)
Patient trans in from An[facility] for continuity of care on women's unit. On arrival alert and oriented. Assessments and vitals checked stable. Oriented to the room and made comfortable in bed. Call bell placed within reach. Will keep monitoring.

## 2015-08-27 NOTE — Progress Notes (Signed)
Post Partum Day 2 Subjective:  Joan Rose is a 19 y.o. G1P0101 [redacted]w[redacted]d s/p SVD after IOL for severe pre-eclampsia.  No acute events overnight.  Pt denies problems with ambulating, voiding or po intake.  She denies nausea or vomiting.  Pain is well controlled.  She has had flatus.  Lochia Minimal.  Plan for birth control is Depo-Provera.  Method of Feeding: Breast, she is trying to pump for her baby in the NICU. She had some elevated BPs overnight into the 140s/90s. She is down 900cc with a total UOP of 3.5L yesterday.   Objective: Blood pressure 127/81, pulse 79, temperature 99 F (37.2 C), temperature source Oral, resp. rate 16, height 5\' 6"  (1.676 m), weight 76.658 kg (169 lb), last menstrual period 12/27/2014, SpO2 99 %, unknown if currently breastfeeding.  Physical Exam:  General: alert, cooperative and no distress Chest: normal WOB Heart: Regular rate Abdomen: +BS, soft, mild TTP (appropriate) Uterine Fundus: firm, below umbillicus DVT Evaluation: No evidence of DVT seen on physical exam. Extremities: Mild nonpitting edema HEENT: Facial edema improved   Recent Labs  08/25/15 1622 08/25/15 2113  HGB 10.9* 9.9*  HCT 32.8* 30.1*    Assessment/Plan:  ASSESSMENT: Joan Rose is a 19 y.o. G1P0101 [redacted]w[redacted]d s/p SVD after IOL for severe pre-eclampsia.   Plan for discharge tomorrow   Mag was stopped last night at [place]. Continue to monitor BPs. Treat for BP>150/90. Continue to monitor UOP with strict I/O. Continue routine PP care Breastfeeding support PRN  LOS: 2 days   Stormy Card 08/27/2015, 8:02 AM

## 2015-08-27 NOTE — Lactation Note (Signed)
This note was copied from the chart of Joan Brewing technologist. Lactation Consultation Note  Patient Name: Joan Rose [id number] Date: 08/27/2015 Reason for consult: Follow-up assessment;NICU baby NICU baby 23 hours old, 37w5dCGA. Mom pumping 1 breast when this LC entered room. Discussed benefits of pumping both breasts simultaneously and assisted mom to pump with both attachments. Mom has EBM flowing. Mom reports that she has already taken colostrum to NICU twice, and baby's mouth was swabbed with the drops. Mom also reports that she has been able to hold the baby STS. Enc mom to hand express after pumping. Mom had pumped once in the last 12 hours, so discussed importance of pumping 8 times/24 hours followed by hand expression. [facility] and expects to be discharged the day before. [facility]ed [facility] and demonstrated piston/hand pump from kit. Mom aware of pumping rooms in NICU. Enc mom to call for assistance as needed.   Maternal Data    Feeding Feeding Type: Formula Length of feed: 30 min  LATCH Score/Interventions                      Lactation Tools Discussed/Used     Consult Status Consult Status: Follow-up Date: 08/28/15 Follow-up type: In-patient    WInocente Salles12/14/2016, 10:24 AM

## 2015-08-27 NOTE — Clinical Social Work Maternal (Signed)
CLINICAL SOCIAL WORK MATERNAL/CHILD NOTE  Patient Details  Name: Joan Rose MRN: [id number] Date of Birth: 10/08/1995  Date:  08/27/2015  Clinical Social Worker Initiating Note:  Codylee Patil E. Lockie Bothun, LCSW Date/ Time Initiated:  08/27/15/0945     Child's Name:  Joan Rose   Legal Guardian:   (Parents: Agness Askari and Jermayne Ledbetter)   Need for Interpreter:  None   Date of Referral:        Reason for Referral:   (No referral-NICU admission)   Referral Source:      Address:  [address], [place], [place] [address]  Phone number:  [phone]   Household Members:  Significant Other   Natural Supports (not living in the home):  Immediate Family (Parents report that their mothers are their greatest support people.  MOB also says her 23 year old sister is a good support.)   Professional Supports: Other (Comment) (MOB reports recently enrolling in the [facility].  Referral will be made to [facility] in [place].)   Employment:     Type of Work:     Education:      Financial Resources:  Medicaid   Other Resources:  WIC   Cultural/Religious Considerations Which May Impact Care: None stated.  MOB's facesheet notes religion as Christian.  Strengths:  Compliance with medical plan , Understanding of illness, Ability to meet basic needs , Home prepared for child , Other (Comment) (MOB states she was in the process of choosing a pediatrician when she came in to deliver.  She has a list of [place] offices, but asked for information on [place] doctors also.  CSW informed parents on how to obtain a list from the NICU.)   Risk Factors/Current Problems:  None   Cognitive State:  Alert , Goal Oriented , Insightful , Linear Thinking    Mood/Affect:  Calm , Relaxed , Interested , Comfortable    CSW Assessment: CSW met with parents in MOB's third floor room/318 to introduce services, offer support and complete  assessment due to baby's admission to NICU at 34.3 weeks.  Parents were pleasant and welcoming of CSW's visit.  They were quiet, but pleasant and easy to engage.  MOB reports not really knowing how she is feeling physically.  She spoke about her symptoms and what prompted her to call her doctor prior to delivery.  CSW commends her for listening to her body and trusting her instincts, as she stated that she didn't know if what she was experiencing was normal.  She reports feeling better now that she is no longer on Magnesium.  When asked how she is feeling emotionally, MOB replied, "I'm trying."  She does not seem to be at a point where she wishes to process her feelings regarding baby's premature birth and admission to NICU. FOB responded that he felt "nervous at first."  CSW normalized and validated this feeling.  Both parents seem to have a good understanding of baby's medical condition at this time and state they feel they have been well updated by NICU staff.  CSW asked them to call CSW if they ever feel they would like to have a Family Conference.  Parents seemed appreciative.   Parents report living together in MOB's apartment in [place].  This it the first child for both of them.  CSW inquired about where baby gets her last name, as it is different from both MOB and FOB.  FOB explained that he is in   the process of getting his last name changed to his mother's last name.  They report having a good support system who live locally.  They state they have all necessary baby items at this time except for the car seat, which they plan to wait to purchase until they know how big baby will be when she goes home.  FOB states he will be able to get the car seat any time.  They report that baby will be sleeping in a crib at home.  CSW asked if parents are familiar with SIDS and they reported that they are not.  CSW provided education on SIDS precautions, which parents commit to adhere to.   CSW provided education on  common emotions often experienced in the first few weeks after delivery as well as information about perinatal mood disorders.  CSW encouraged them to monitor their level of emotion and to call CSW and or a doctor if they have concerns about their emotions at any time, have emotions that are causing an inability to enjoy baby's infancy, or feel their emotions are interfering with caring for themselves or baby.  CSW explained that PPD is common, normal, and treatable once symptoms have been identified.  CSW explained ongoing support services offered by NICU CSW, Spiritual Care staff, and [facility] [employer] [facility] and invited parents to the luncheon tomorrow.  CSW offered gas cards from [facility] [employer] as they will be traveling from [place] to be with baby.  They were appreciative, accepted the cards and state no issues with transportation. Parents seemed appreciative of CSW's intervention and state no questions, concerns or needs at this time.   CSW Plan/Description:  Patient/Family Education , Information/Referral to Community Resources , Psychosocial Support and Ongoing Assessment of Needs    Ceazia Harb Elizabeth, LCSW 08/27/2015, 11:40 AM 

## 2015-08-28 MED ORDER — IBUPROFEN 600 MG PO TABS: 600.0000 mg | ORAL_TABLET | Freq: Four times a day (QID) | ORAL | Status: DC

## 2015-08-28 NOTE — Lactation Note (Signed)
This note was copied from the chart of Girl Brewing technologist. Lactation Consultation Note  Patient Name: Girl Samarya Kovack [id number] Date: 08/28/2015 Reason for consult: Follow-up assessment;NICU baby  NICU baby 3 hours old, [redacted]w[redacted]d CGA. Mom to be discharged today. Mom states that she does not need a [facility] loaner pump. Mom has bottles of colostrum at bedside and is about to walk to NICU to take them to the baby. Enc mom to continue pumping 8 times/24 hours followed by hand expression. Enc mom to continue offering STS and nuzzling at breast in NICU. Mom aware of OP/BFSG and [employer] phone line assistance.  Maternal Data    Feeding Feeding Type: Formula Length of feed: 30 min  LATCH Score/Interventions                      Lactation Tools Discussed/Used     Consult Status Consult Status: Complete    Reagen Haberman 08/28/2015, 11:08 AM

## 2015-08-28 NOTE — Progress Notes (Signed)
Pt is discharged in the care of friend with R.N. Escort.  Denies any pain or discomfort. Infant to remain  In [place]. Discharged instructions  With Rx was hiven to pt with good understanding. Questions asked and answered.

## 2015-08-28 NOTE — Discharge Summary (Signed)
OB Discharge Summary     Patient Name: Joan Rose DOB: [birth year] MRN: [id number]  Date of admission: 08/24/2015 Delivering MD: Stormy Card   Date of discharge: 08/28/2015  Admitting diagnosis: 58 WEEKS SWOLLEN ANKLES FEET DIFFICULTY BREATHING Intrauterine pregnancy: [redacted]w[redacted]d     Secondary diagnosis:  Principal Problem:   Severe preeclampsia Active Problems:   Supervision of normal first pregnancy   Fetal echogenic intracardiac focus on prenatal ultrasound   Preeclampsia  Additional problems: s/p vaginal delivery     Discharge diagnosis: Preterm Pregnancy Delivered                                                                                                Post partum procedures:postpartum magnesium for seizure prevention  Augmentation: AROM, Pitocin, Cytotec and Foley Balloon  Complications: None  Hospital course:  Induction of Labor With Vaginal Delivery   19 y.o. yo G1P0101 at [redacted]w[redacted]d was admitted to the hospital 08/24/2015 for induction of labor.  Indication for induction: Preeclampsia.  Patient had an uncomplicated labor course as follows:  Membrane Rupture Time/Date: 7:54 PM ,08/25/2015   Intrapartum Procedures: Episiotomy: None [1]                                         Lacerations:  Perineal [11];3rd degree [4]  Patient had delivery of a Viable infant.  Information for the patient's newborn:  Januari, Janos Girl Jared [id number]  Delivery Method: Vaginal, Spontaneous Delivery (Filed from Delivery Summary)   08/25/2015  Details of delivery can be found in separate delivery note.  Patient had a routine postpartum course. Patient is discharged home No discharge date for patient encounter.    Physical exam  Filed Vitals:   08/27/15 1723 08/27/15 2146 08/28/15 0548 08/28/15 0625  BP: 140/91 144/85 141/92   Pulse: 72 85 78   Temp: 99.2 F (37.3 C) 98.5 F (36.9 C) 98 F (36.7 C)   TempSrc: Oral Oral Oral   Resp: 18 18 18    Height:      Weight:    162 lb 12  oz (73.823 kg)  SpO2: 100% 100% 100%    General: alert, cooperative and no distress Lochia: appropriate Uterine Fundus: firm DVT Evaluation: No evidence of DVT seen on physical exam. Negative Homan's sign. Labs: Lab Results  Component Value Date   WBC 14.9* 08/25/2015   HGB 9.9* 08/25/2015   HCT 30.1* 08/25/2015   MCV 84.1 08/25/2015   PLT 156 08/25/2015   CMP Latest Ref Rng 08/25/2015  Glucose 65 - 99 mg/dL 100(H)  BUN 6 - 20 mg/dL 12  Creatinine 0.44 - 1.00 mg/dL 0.61  Sodium 135 - 145 mmol/L 136  Potassium 3.5 - 5.1 mmol/L 3.8  Chloride 101 - 111 mmol/L 109  CO2 22 - 32 mmol/L 22  Calcium 8.9 - 10.3 mg/dL 8.5(L)  Total Protein 6.5 - 8.1 g/dL 5.8(L)  Total Bilirubin 0.3 - 1.2 mg/dL 0.5  Alkaline Phos 38 - 126 U/L 79  AST 15 -  41 U/L 24  ALT 14 - 54 U/L 13(L)    Discharge instruction: per After Visit Summary and "Baby and Me Booklet".  After visit meds:    Medication List    STOP taking these medications        Doxylamine-Pyridoxine 10-10 MG Tbec  Commonly known as:  DICLEGIS      TAKE these medications        ferrous sulfate 325 (65 FE) MG tablet  Take 1 tablet (325 mg total) by mouth 2 (two) times daily with a meal.     FLINSTONES GUMMIES OMEGA-3 DHA PO  Take 2 tablets by mouth daily. Takes 2 daily     ibuprofen 600 MG tablet  Commonly known as:  ADVIL,MOTRIN  Take 1 tablet (600 mg total) by mouth every 6 (six) hours.        Diet: routine diet  Activity: Advance as tolerated. Pelvic rest for 6 weeks.   Outpatient follow up:6 weeks Follow up Appt:Future Appointments Date Time Provider [place]  10/07/2015 10:00 AM Roma Schanz, CNM [facility]   Follow up Visit:No Follow-up on file.  Postpartum contraception: Undecided  Newborn Data: Live born female  Birth Weight: 4 lb 6.4 oz (1997 g) APGAR: 7, 7  Baby Feeding: Bottle and Breast Disposition:NICU   08/28/2015 Quran Vasco JEHIEL, DO

## 2015-08-28 NOTE — Discharge Instructions (Signed)

## 2015-09-01 ENCOUNTER — Telehealth: Payer: Self-pay | Admitting: *Deleted

## 2015-09-01 NOTE — Telephone Encounter (Signed)
Johnetta from the [place] [facility] states pt HGB 06/29/2015 10.1, delivered on 08/25/2015 SVD. Pt taking Flinstone Vit and Iron supplement BID.

## 2015-09-02 ENCOUNTER — Ambulatory Visit: Payer: Self-pay

## 2015-09-02 NOTE — Lactation Note (Signed)
This note was copied from the chart of Joan Brewing technologist. Lactation Consultation Note  Patient Name: Joan Rose [id number] Date: 09/02/2015 Reason for consult: Follow-up assessment;NICU baby;Infant < 6lbs;Late preterm infant   Called to NICU to see mom of 34 day old infant with c/o decreased milk supply. Mom reports she is receiving very little from right breast. Mom reports she has had surgery to right breast for biopsy's. One was on the upper aspect of the breast and a larger lump was removed to outer aspect of right breast, unsure if ducts or nerves were interrupted and affecting milk supply. Mom reports she pumps about 6 times a day and does not pump for up to 9 hours at night. She is getting about 1.5-2 oz a pumping. She has enough to feed infant currently but is barely keeping up. Enc mom to pump every 2-3 hours a day with a 5 hour stretch at night, practice relaxation with pumping and not watch pump, massage breast before and after pumping, practice STS and pump when visiting infant at bedside. Gave mom handout on Fenugreek with instructions for use. Enc mom to call with further questions/concerns.    Maternal Data Formula Feeding for Exclusion: No Does the patient have breastfeeding experience prior to this delivery?: No  Feeding Feeding Type: Breast Milk Length of feed: 30 min  LATCH Score/Interventions                      Lactation Tools Discussed/Used WIC Program: Yes Pump Review: Setup, frequency, and cleaning   Consult Status Consult Status: PRN Follow-up type: Call as needed    Donn Pierini 09/02/2015, 7:01 PM

## 2015-09-10 ENCOUNTER — Ambulatory Visit: Payer: Self-pay

## 2015-09-10 NOTE — Lactation Note (Signed)
This note was copied from the chart of Joan Brewing technologist. Lactation Consultation Note  Patient Name: Joan Rose [id number] Date: 09/10/2015 Reason for consult: Follow-up assessment;NICU baby  NICU baby 50 weeks old. Mom reports that she did not get any of the recommended supplements, but she did follow all of the other pumping advice and her supply has increased. Mom states that she is not sure if she wants to put the baby to breast. However, she does intend to continue pumping and giving the baby EBM with the bottle. Enc mom to call for [facility] assistance if she changes her mind. Mom aware of OP/BFSG and [place] phone line assistance after D/C.  Maternal Data    Feeding    LATCH Score/Interventions                      Lactation Tools Discussed/Used     Consult Status Consult Status: PRN    Inocente Salles 09/10/2015, 10:06 AM

## 2015-10-07 ENCOUNTER — Ambulatory Visit (INDEPENDENT_AMBULATORY_CARE_PROVIDER_SITE_OTHER): Payer: Medicaid Other | Admitting: Women's Health

## 2015-10-07 ENCOUNTER — Encounter: Payer: Self-pay | Admitting: *Deleted

## 2015-10-07 ENCOUNTER — Encounter: Payer: Self-pay | Admitting: Women's Health

## 2015-10-07 ENCOUNTER — Ambulatory Visit (INDEPENDENT_AMBULATORY_CARE_PROVIDER_SITE_OTHER): Payer: Medicaid Other | Admitting: *Deleted

## 2015-10-07 DIAGNOSIS — Z30013 Encounter for initial prescription of injectable contraceptive: Secondary | ICD-10-CM

## 2015-10-07 DIAGNOSIS — Z3202 Encounter for pregnancy test, result negative: Secondary | ICD-10-CM | POA: Diagnosis not present

## 2015-10-07 DIAGNOSIS — Z3200 Encounter for pregnancy test, result unknown: Secondary | ICD-10-CM

## 2015-10-07 LAB — POCT URINE PREGNANCY: Preg Test, Ur: NEGATIVE

## 2015-10-07 MED ORDER — MEDROXYPROGESTERONE ACETATE 150 MG/ML IM SUSP
150.0000 mg | Freq: Once | INTRAMUSCULAR | Status: AC
  Administered 2015-10-07: 150 mg via INTRAMUSCULAR

## 2015-10-07 MED ORDER — MEDROXYPROGESTERONE ACETATE 150 MG/ML IM SUSP: 150.0000 mg | INTRAMUSCULAR | Status: DC

## 2015-10-07 NOTE — Progress Notes (Signed)
Patient ID: Joan Rose, female   DOB: 1996/02/08, 20 y.o.   MRN: [id number] Pt here today for DEPO injection. Pt had a negative UPT today. Pt tolerated well.

## 2015-10-07 NOTE — Patient Instructions (Addendum)
Tips To Increase Milk Supply  Lots of water! Enough so that your urine is clear  Plenty of calories, if you're not getting enough calories, your milk supply can decrease  Breastfeed/pump often, every 2-3 hours x 20-21mins  Fenugreek 3 pills 3 times a day, this may make your urine smell like maple syrup  Mother's Milk Tea  Lactation cookies, google for the recipe  Real oatmeal  Medroxyprogesterone injection [Contraceptive] What is this medicine? MEDROXYPROGESTERONE (me DROX ee proe JES te rone) contraceptive injections prevent pregnancy. They provide effective birth control for 3 months. Depo-subQ Provera 104 is also used for treating pain related to endometriosis. This medicine may be used for other purposes; ask your health care provider or pharmacist if you have questions. What should I tell my health care provider before I take this medicine? They need to know if you have any of these conditions: -frequently drink alcohol -asthma -blood vessel disease or a history of a blood clot in the lungs or legs -bone disease such as osteoporosis -breast cancer -diabetes -eating disorder (anorexia nervosa or bulimia) -high blood pressure -HIV infection or AIDS -kidney disease -liver disease -mental depression -migraine -seizures (convulsions) -stroke -tobacco smoker -vaginal bleeding -an unusual or allergic reaction to medroxyprogesterone, other hormones, medicines, foods, dyes, or preservatives -pregnant or trying to get pregnant -breast-feeding How should I use this medicine? Depo-Provera Contraceptive injection is given into a muscle. Depo-subQ Provera 104 injection is given under the skin. These injections are given by a health care professional. You must not be pregnant before getting an injection. The injection is usually given during the first 5 days after the start of a menstrual period or 6 weeks after delivery of a baby. Talk to your pediatrician regarding the use of this  medicine in children. Special care may be needed. These injections have been used in female children who have started having menstrual periods. Overdosage: If you think you have taken too much of this medicine contact a poison control center or emergency room at once. NOTE: This medicine is only for you. Do not share this medicine with others. What if I miss a dose? Try not to miss a dose. You must get an injection once every 3 months to maintain birth control. If you cannot keep an appointment, call and reschedule it. If you wait longer than 13 weeks between Depo-Provera contraceptive injections or longer than 14 weeks between Depo-subQ Provera 104 injections, you could get pregnant. Use another method for birth control if you miss your appointment. You may also need a pregnancy test before receiving another injection. What may interact with this medicine? Do not take this medicine with any of the following medications: -bosentan This medicine may also interact with the following medications: -aminoglutethimide -antibiotics or medicines for infections, especially rifampin, rifabutin, rifapentine, and griseofulvin -aprepitant -barbiturate medicines such as phenobarbital or primidone -bexarotene -carbamazepine -medicines for seizures like ethotoin, felbamate, oxcarbazepine, phenytoin, topiramate -modafinil -St. John's wort This list may not describe all possible interactions. Give your health care provider a list of all the medicines, herbs, non-prescription drugs, or dietary supplements you use. Also tell them if you smoke, drink alcohol, or use illegal drugs. Some items may interact with your medicine. What should I watch for while using this medicine? This drug does not protect you against HIV infection (AIDS) or other sexually transmitted diseases. Use of this product may cause you to lose calcium from your bones. Loss of calcium may cause weak bones (osteoporosis). Only use this  product for  more than 2 years if other forms of birth control are not right for you. The longer you use this product for birth control the more likely you will be at risk for weak bones. Ask your health care professional how you can keep strong bones. You may have a change in bleeding pattern or irregular periods. Many females stop having periods while taking this drug. If you have received your injections on time, your chance of being pregnant is very low. If you think you may be pregnant, see your health care professional as soon as possible. Tell your health care professional if you want to get pregnant within the next year. The effect of this medicine may last a long time after you get your last injection. What side effects may I notice from receiving this medicine? Side effects that you should report to your doctor or health care professional as soon as possible: -allergic reactions like skin rash, itching or hives, swelling of the face, lips, or tongue -breast tenderness or discharge -breathing problems -changes in vision -depression -feeling faint or lightheaded, falls -fever -pain in the abdomen, chest, groin, or leg -problems with balance, talking, walking -unusually weak or tired -yellowing of the eyes or skin Side effects that usually do not require medical attention (report to your doctor or health care professional if they continue or are bothersome): -acne -fluid retention and swelling -headache -irregular periods, spotting, or absent periods -temporary pain, itching, or skin reaction at site where injected -weight gain This list may not describe all possible side effects. Call your doctor for medical advice about side effects. You may report side effects to FDA at 1-800-FDA-1088. Where should I keep my medicine? This does not apply. The injection will be given to you by a health care professional. NOTE: This sheet is a summary. It may not cover all possible information. If you have  questions about this medicine, talk to your doctor, pharmacist, or health care provider.    2016, Elsevier/Gold Standard. (2008-09-20 18:37:56)

## 2015-10-07 NOTE — Progress Notes (Signed)
Patient ID: Joan Rose, female   DOB: 04/24/1996, 20 y.o.   MRN: [id number] Subjective:    Joan Rose is a 20 y.o. G12P0101 African American female who presents for a postpartum visit. She is 6 weeks postpartum following a spontaneous vaginal delivery at 34.3 gestational weeks after IOL for severe pre-e. Anesthesia: epidural. I have fully reviewed the prenatal and intrapartum course. Postpartum course has been uncomplicated. Baby's course has been complicated by 2 [id number] NICU stay d/t prematurity/temp instability. Baby is feeding by breast. Feels supply is low. Bleeding on period now. Bowel function is normal. Bladder function is normal. Patient is not sexually active. Last sexual activity: prior to birth of baby. Contraception method is wants depo. Postpartum depression screening: negative. Score 3.  Last pap never- just turned 20yo.  The following portions of the patient's history were reviewed and updated as appropriate: allergies, current medications, past medical history, past surgical history and problem list.  Review of Systems Pertinent items are noted in HPI.   Filed Vitals:   10/07/15 1018  BP: 110/70  Pulse: 68  Weight: 148 lb (67.132 kg)   Patient's last menstrual period was 10/06/2015.  Objective:   General:  alert, cooperative and no distress   Breasts:  deferred, no complaints  Lungs: clear to auscultation bilaterally  Heart:  regular rate and rhythm  Abdomen: soft, nontender   Vulva: normal  Vagina: normal vagina  Cervix:  closed  Corpus: Well-involuted  Adnexa:  Non-palpable  Rectal Exam: No hemorrhoids        Assessment:   Postpartum exam 6 wks s/p SVB at 34wks d/t severe pre-e Breastfeeding Depression screening Contraception counseling  Needs pap  Plan:  Gave printed info on increasing milk supply Contraception: rx depo today, return pm for 1st injection, then q 53mths Follow up in: this pm for depo, then 60mth for pap & physical  Tawnya Crook CNM, [facility] 10/07/2015 10:26 AM

## 2015-11-10 ENCOUNTER — Encounter: Payer: Self-pay | Admitting: Women's Health

## 2015-11-10 ENCOUNTER — Other Ambulatory Visit (HOSPITAL_COMMUNITY)
Admission: RE | Admit: 2015-11-10 | Discharge: 2015-11-10 | Disposition: A | Discharge status: Home / Self Care | Payer: Medicaid Other | Source: Ambulatory Visit | Attending: Obstetrics & Gynecology | Admitting: Obstetrics & Gynecology

## 2015-11-10 ENCOUNTER — Ambulatory Visit (INDEPENDENT_AMBULATORY_CARE_PROVIDER_SITE_OTHER): Payer: Medicaid Other | Admitting: Women's Health

## 2015-11-10 VITALS — BP 118/68 | HR 72 | Ht 66.5 in | Wt 148.0 lb

## 2015-11-10 DIAGNOSIS — Z9889 Other specified postprocedural states: Secondary | ICD-10-CM | POA: Insufficient documentation

## 2015-11-10 DIAGNOSIS — Z01419 Encounter for gynecological examination (general) (routine) without abnormal findings: Secondary | ICD-10-CM

## 2015-11-10 DIAGNOSIS — Z0000 Encounter for general adult medical examination without abnormal findings: Secondary | ICD-10-CM

## 2015-11-10 DIAGNOSIS — Z113 Encounter for screening for infections with a predominantly sexual mode of transmission: Secondary | ICD-10-CM | POA: Insufficient documentation

## 2015-11-10 DIAGNOSIS — O1410 Severe pre-eclampsia, unspecified trimester: Secondary | ICD-10-CM | POA: Insufficient documentation

## 2015-11-10 NOTE — Progress Notes (Signed)
Patient ID: Joan Rose, female   DOB: 11-Feb-1996, 20 y.o.   MRN: [id number] Subjective:   Joan Rose is a 20 y.o. G26P0101 African American female here for a routine well-woman exam.  No LMP recorded. Patient has had an injection.   Baby is 39wks old. Was induced at 34.3wks d/t severe pre-e. Goes back to work Wed.   Current complaints: none PCP: none       Does not desire labs  Social History: Sexual: heterosexual Marital Status: dating Living situation: alone w/ child Occupation: Educational psychologist at [employer], starts back Wed Tobacco/alcohol: no tobacco or etoh Illicit drugs: no history of illicit drug use  The following portions of the patient's history were reviewed and updated as appropriate: allergies, current medications, past family history, past medical history, past social history, past surgical history and problem list.  Past Medical History Past Medical History  Diagnosis Date  . Medical history non-contributory     Past Surgical History Past Surgical History  Procedure Laterality Date  . Breast biopsy Right 09/30/2014    Procedure: BREAST BIOPSY;  Surgeon: Jamesetta So, MD;  Location: [facility];  Service: General;  Laterality: Right;    Gynecologic History G1P0101  No LMP recorded. Patient has had an injection. Contraception: Depo-Provera injections Last Pap: never. Results were: n/a Last mammogram: never, did have breast u/s 2015 d/t lump in Rt breast which was birads 3 w/ resulting bx and excision of that mass and another smaller mass- path returned fibroadenoma- no malignancy- had appropriate f/u then was told to f/u 20yr which would have been this January-however she has been busy w/ new baby so has not made that f/u appt w/ Dr. Arnoldo Morale.  Last TCS: never  Obstetric History OB History  Gravida Para Term Preterm AB SAB TAB Ectopic Multiple Living  1 1  1      0 1    # Outcome Date GA Lbr Len/2nd Weight Sex Delivery Anes PTL Lv  1 Preterm 08/25/15 [redacted]w[redacted]d 02:46 /  00:24 4 lb 6.4 oz (1.997 kg) F Vag-Spont EPI  Y      Current Medications Current Outpatient Prescriptions on File Prior to Visit  Medication Sig Dispense Refill  . medroxyPROGESTERone (DEPO-PROVERA) 150 MG/ML injection Inject 1 mL (150 mg total) into the muscle every 3 (three) months. 1 mL 3  . Pediatric Multiple Vit-C-FA (FLINSTONES GUMMIES OMEGA-3 DHA PO) Take 2 tablets by mouth daily. Reported on 10/07/2015    . ferrous sulfate 325 (65 FE) MG tablet Take 1 tablet (325 mg total) by mouth 2 (two) times daily with a meal. (Patient not taking: Reported on 11/10/2015) 60 tablet 3  . ibuprofen (ADVIL,MOTRIN) 600 MG tablet Take 1 tablet (600 mg total) by mouth every 6 (six) hours. (Patient not taking: Reported on 11/10/2015) 30 tablet 0   No current facility-administered medications on file prior to visit.    Review of Systems Patient denies any headaches, blurred vision, shortness of breath, chest pain, abdominal pain, problems with bowel movements, urination, or intercourse.  Objective:  BP 118/68 mmHg  Pulse 72  Ht 5' 6.5" (1.689 m)  Wt 148 lb (67.132 kg)  BMI 23.53 kg/m2  Breastfeeding? No Physical Exam  General:  Well developed, well nourished, no acute distress. She is alert and oriented x3. Skin:  Warm and dry Neck:  Midline trachea, no thyromegaly or nodules Cardiovascular: Regular rate and rhythm, no murmur heard Lungs:  Effort normal, all lung fields clear to auscultation bilaterally Breasts:  No dominant palpable mass, retraction, or nipple discharge Abdomen:  Soft, non tender, no hepatosplenomegaly or masses Pelvic:  External genitalia is normal in appearance.  The vagina is normal in appearance. The cervix is bulbous, no CMT.  Thin prep pap is done w/ reflex HR HPV cotesting. Uterus is felt to be normal size, shape, and contour.  No adnexal masses or tenderness noted. Extremities:  No swelling or varicosities noted Psych:  She has a normal mood and affect  Assessment:    Healthy well-woman exam H/O Rt breast mass x 2 w/ excision, path fibroadenomas 10wks postpartum  Plan:  GC/CT from pap, declines any other labs at this time F/U as scheduled for next depo, then 20yr for physical, or sooner if needed Mammogram @20yo  or sooner if problems Colonoscopy @20yo  or sooner if problems Call Dr. Arnoldo Morale office to see if further f/u still indicated as she had normal breast exam today  Tawnya Crook CNM, [facility] 11/10/2015 10:06 AM

## 2015-11-11 LAB — CYTOLOGY - PAP

## 2015-12-30 ENCOUNTER — Ambulatory Visit (INDEPENDENT_AMBULATORY_CARE_PROVIDER_SITE_OTHER): Payer: Medicaid Other | Admitting: *Deleted

## 2015-12-30 ENCOUNTER — Encounter: Payer: Self-pay | Admitting: *Deleted

## 2015-12-30 DIAGNOSIS — Z3202 Encounter for pregnancy test, result negative: Secondary | ICD-10-CM

## 2015-12-30 DIAGNOSIS — Z3042 Encounter for surveillance of injectable contraceptive: Secondary | ICD-10-CM

## 2015-12-30 LAB — POCT URINE PREGNANCY: PREG TEST UR: NEGATIVE

## 2015-12-30 MED ORDER — MEDROXYPROGESTERONE ACETATE 150 MG/ML IM SUSP
150.0000 mg | Freq: Once | INTRAMUSCULAR | Status: AC
  Administered 2015-12-30: 150 mg via INTRAMUSCULAR

## 2015-12-30 NOTE — Progress Notes (Signed)
Pt here for Depo. Pt tolerated shot well. Return in 12 weeks for next shot. JSY 

## 2016-03-23 ENCOUNTER — Encounter: Payer: Self-pay | Admitting: *Deleted

## 2016-03-23 ENCOUNTER — Ambulatory Visit (INDEPENDENT_AMBULATORY_CARE_PROVIDER_SITE_OTHER): Payer: Medicaid Other | Admitting: *Deleted

## 2016-03-23 DIAGNOSIS — Z3042 Encounter for surveillance of injectable contraceptive: Secondary | ICD-10-CM

## 2016-03-23 DIAGNOSIS — Z3202 Encounter for pregnancy test, result negative: Secondary | ICD-10-CM | POA: Diagnosis not present

## 2016-03-23 LAB — POCT URINE PREGNANCY: PREG TEST UR: NEGATIVE

## 2016-03-23 MED ORDER — MEDROXYPROGESTERONE ACETATE 150 MG/ML IM SUSP
150.0000 mg | Freq: Once | INTRAMUSCULAR | Status: AC
  Administered 2016-03-23: 150 mg via INTRAMUSCULAR

## 2016-03-23 NOTE — Progress Notes (Signed)
Pt here for Depo. Pt tolerated shot well. Return in 12 weeks for next shot. JSY 

## 2016-06-15 ENCOUNTER — Encounter: Payer: Self-pay | Admitting: *Deleted

## 2016-06-15 ENCOUNTER — Ambulatory Visit (INDEPENDENT_AMBULATORY_CARE_PROVIDER_SITE_OTHER): Payer: Medicaid Other | Admitting: *Deleted

## 2016-06-15 DIAGNOSIS — Z3202 Encounter for pregnancy test, result negative: Secondary | ICD-10-CM

## 2016-06-15 DIAGNOSIS — Z3042 Encounter for surveillance of injectable contraceptive: Secondary | ICD-10-CM | POA: Diagnosis not present

## 2016-06-15 DIAGNOSIS — Z308 Encounter for other contraceptive management: Secondary | ICD-10-CM

## 2016-06-15 LAB — POCT URINE PREGNANCY: PREG TEST UR: NEGATIVE

## 2016-06-15 MED ORDER — MEDROXYPROGESTERONE ACETATE 150 MG/ML IM SUSP
150.0000 mg | Freq: Once | INTRAMUSCULAR | Status: AC
  Administered 2016-06-15: 150 mg via INTRAMUSCULAR

## 2016-06-15 NOTE — Progress Notes (Signed)
Pt here for Depo. Pt tolerated shot well. Return in 12 weeks for next shot. JSY 

## 2016-08-30 ENCOUNTER — Other Ambulatory Visit: Payer: Self-pay | Admitting: Women's Health

## 2016-09-14 ENCOUNTER — Encounter: Payer: Self-pay | Admitting: *Deleted

## 2016-09-14 ENCOUNTER — Ambulatory Visit (INDEPENDENT_AMBULATORY_CARE_PROVIDER_SITE_OTHER): Payer: Medicaid Other | Admitting: *Deleted

## 2016-09-14 DIAGNOSIS — Z308 Encounter for other contraceptive management: Secondary | ICD-10-CM | POA: Diagnosis not present

## 2016-09-14 DIAGNOSIS — Z3202 Encounter for pregnancy test, result negative: Secondary | ICD-10-CM

## 2016-09-14 LAB — POCT URINE PREGNANCY: PREG TEST UR: NEGATIVE

## 2016-09-14 MED ORDER — MEDROXYPROGESTERONE ACETATE 150 MG/ML IM SUSP
150.0000 mg | Freq: Once | INTRAMUSCULAR | Status: AC
  Administered 2016-09-14: 150 mg via INTRAMUSCULAR

## 2016-09-14 NOTE — Progress Notes (Signed)
Pt here for Depo. Pt tolerated shot well. Return in 12 weeks for next shot. JSY 

## 2016-11-11 ENCOUNTER — Encounter: Payer: Self-pay | Admitting: Adult Health

## 2016-11-11 ENCOUNTER — Ambulatory Visit (INDEPENDENT_AMBULATORY_CARE_PROVIDER_SITE_OTHER): Payer: Medicaid Other | Admitting: Adult Health

## 2016-11-11 VITALS — BP 100/60 | HR 84 | Ht 66.0 in | Wt 156.5 lb

## 2016-11-11 DIAGNOSIS — K582 Mixed irritable bowel syndrome: Secondary | ICD-10-CM

## 2016-11-11 DIAGNOSIS — Z3009 Encounter for other general counseling and advice on contraception: Secondary | ICD-10-CM

## 2016-11-11 DIAGNOSIS — Z308 Encounter for other contraceptive management: Secondary | ICD-10-CM

## 2016-11-11 DIAGNOSIS — Z3042 Encounter for surveillance of injectable contraceptive: Secondary | ICD-10-CM

## 2016-11-11 DIAGNOSIS — Z01419 Encounter for gynecological examination (general) (routine) without abnormal findings: Secondary | ICD-10-CM

## 2016-11-11 MED ORDER — MEDROXYPROGESTERONE ACETATE 150 MG/ML IM SUSP: INTRAMUSCULAR | 4 refills | Status: DC

## 2016-11-11 NOTE — Patient Instructions (Signed)
Physical in 1 year Pap in 2020 

## 2016-11-11 NOTE — Progress Notes (Signed)
Patient ID: Joan Rose, female   DOB: 11/25/1995, 21 y.o.   MRN: [id number] History of Present Illness: Joan Rose is a 21 year old black female, G1P1 in for well woman gyn exam, she has family planning medicaid, she had normal pap 11/10/15. She needs refill on depo.She does not have periods with depo.   Current Medications, Allergies, Past Medical History, Past Surgical History, Family History and Social History were reviewed in [employer] record.     Review of Systems: Patient denies any headaches, hearing loss, fatigue, blurred vision, shortness of breath, chest pain, abdominal pain, problems with urination, or intercourse. No joint pain or mood swings.Feels bloated at times and has some cramps and may have constipation or diarrhea. No periods on depo.    Physical Exam:BP 100/60 (BP Location: Left Arm, Patient Position: Sitting, Cuff Size: Normal)   Pulse 84   Ht 5\' 6"  (1.676 m)   Wt 156 lb 8 oz (71 kg)   Breastfeeding? No   BMI 25.26 kg/m  General:  Well developed, well nourished, no acute distress Skin:  Warm and dry Neck:  Midline trachea, normal thyroid, good ROM, no lymphadenopathy Lungs; Clear to auscultation bilaterally Breast:  No dominant palpable mass, retraction, or nipple discharge Cardiovascular: Regular rate and rhythm Abdomen:  Soft, non tender, no hepatosplenomegaly Pelvic:  External genitalia is normal in appearance, no lesions.  The vagina is normal in appearance. Urethra has no lesions or masses. The cervix is bulbous.  Uterus is felt to be normal size, shape, and contour.  No adnexal masses or tenderness noted.Bladder is non tender, no masses felt.GC/CHL obtained. Extremities/musculoskeletal:  No swelling or varicosities noted, no clubbing or cyanosis Psych:  No mood changes, alert and cooperative,seems happy PHQ 2 score 0.Discussed changing diet and if changes mind and wants to try meds for IBS call me.  Impression: 1. Well woman exam with  routine gynecological exam   2. Family planning   3. Irritable bowel syndrome with both constipation and diarrhea   4. Encounter for surveillance of injectable contraceptive       Plan:  Meds ordered this encounter  Medications  . medroxyPROGESTERone (DEPO-PROVERA) 150 MG/ML injection    Sig: ADMINISTER 1 ML(150 MG) IN THE MUSCLE EVERY 3 MONTHS    Dispense:  1 mL    Refill:  4    Order Specific Question:   Supervising Provider    Answer:   Tania Ade H [[id number]]  Check HIV and RPR GC/CHL sent Physical in 1 year Pap in 2020

## 2016-11-12 LAB — GC/CHLAMYDIA PROBE AMP
CHLAMYDIA, DNA PROBE: NEGATIVE
NEISSERIA GONORRHOEAE BY PCR: NEGATIVE

## 2016-11-12 LAB — RPR: RPR: NONREACTIVE

## 2016-11-12 LAB — HIV ANTIBODY (ROUTINE TESTING W REFLEX): HIV SCREEN 4TH GENERATION: NONREACTIVE

## 2016-12-07 ENCOUNTER — Ambulatory Visit (INDEPENDENT_AMBULATORY_CARE_PROVIDER_SITE_OTHER): Payer: Medicaid Other

## 2016-12-07 VITALS — Wt 156.8 lb

## 2016-12-07 DIAGNOSIS — Z3202 Encounter for pregnancy test, result negative: Secondary | ICD-10-CM

## 2016-12-07 DIAGNOSIS — Z3049 Encounter for surveillance of other contraceptives: Secondary | ICD-10-CM | POA: Diagnosis not present

## 2016-12-07 DIAGNOSIS — Z3042 Encounter for surveillance of injectable contraceptive: Secondary | ICD-10-CM

## 2016-12-07 LAB — POCT URINE PREGNANCY: PREG TEST UR: NEGATIVE

## 2016-12-07 MED ORDER — MEDROXYPROGESTERONE ACETATE 150 MG/ML IM SUSP
150.0000 mg | Freq: Once | INTRAMUSCULAR | Status: AC
  Administered 2016-12-07: 150 mg via INTRAMUSCULAR

## 2016-12-07 NOTE — Progress Notes (Signed)
PT here for Depo shot 150 mg IM given RT Deltiod. Tolerated well. Return 12 week for next shot. P Jeanni Allshouse CMA

## 2017-01-24 ENCOUNTER — Emergency Department (HOSPITAL_COMMUNITY)
Admission: EM | Admit: 2017-01-24 | Discharge: 2017-01-24 | Disposition: A | Discharge status: Home / Self Care | Payer: Self-pay | Attending: Emergency Medicine | Admitting: Emergency Medicine

## 2017-01-24 ENCOUNTER — Encounter (HOSPITAL_COMMUNITY): Payer: Self-pay

## 2017-01-24 DIAGNOSIS — R21 Rash and other nonspecific skin eruption: Secondary | ICD-10-CM

## 2017-01-24 MED ORDER — SULFAMETHOXAZOLE-TRIMETHOPRIM 800-160 MG PO TABS
1.0000 | ORAL_TABLET | Freq: Two times a day (BID) | ORAL | 0 refills | Status: AC
Start: 2017-01-24 — End: 2017-01-31

## 2017-01-24 MED ORDER — MUPIROCIN CALCIUM 2 % NA OINT: TOPICAL_OINTMENT | NASAL | 0 refills | Status: DC

## 2017-01-24 NOTE — ED Triage Notes (Signed)
Pt has a red raised, warm area to right mid thigh x 3 days

## 2017-01-24 NOTE — ED Provider Notes (Signed)
[facility] DEPT Provider Note   CSN: [id number] Arrival date & time: 01/24/17  1706   By signing my name below, I, Eunice Blase, attest that this documentation has been prepared under the direction and in the presence of Levette Paulick, PA-C. Electronically Signed: Eunice Blase, Scribe. 01/24/17. 7:18 PM.   History   Chief Complaint Chief Complaint  Patient presents with  . Rash   The history is provided by the patient and medical records. No language interpreter was used.    Joan Rose is a 21 y.o. female no pertinent PMHx on file who presents to the Emergency Department with concern for a raised red knot to the R thigh x 3 days. She states the redness surrounding this has progressively spread outward from the original site, and she describes 7/10 constant discomfort to the area that is worse with contact. No modifying factors noted. Pt notes h/o similar treated with an ointment cream successfully "a while ago". No new exposures. No fevers, chills, other affected areas or any other complaints noted at this time.    Past Medical History:  Diagnosis Date  . Medical history non-contributory     Patient Active Problem List   Diagnosis Date Noted  . Preterm delivery 11/10/2015  . H/O Preeclampsia, severe 11/10/2015  . History of breast lump/mass excision 11/10/2015  . Chlamydia infection affecting pregnancy in first trimester, antepartum 03/03/2015  . Irregular menses 06/07/2013    Past Surgical History:  Procedure Laterality Date  . BREAST BIOPSY Right 09/30/2014   Procedure: BREAST BIOPSY;  Surgeon: Jamesetta So, MD;  Location: [facility];  Service: General;  Laterality: Right;    OB History    Gravida Para Term Preterm AB Living   1 1   1   1    SAB TAB Ectopic Multiple Live Births         0 1       Home Medications    Prior to Admission medications   Medication Sig Start Date End Date Taking? Authorizing Provider  medroxyPROGESTERone (DEPO-PROVERA) 150  MG/ML injection ADMINISTER 1 ML(150 MG) IN THE MUSCLE EVERY 3 MONTHS 11/11/16   Estill Dooms, NP    Family History Family History  Problem Relation Age of Onset  . Cancer Maternal Grandmother        throat    Social History Social History  Substance Use Topics  . Smoking status: Never Smoker  . Smokeless tobacco: Never Used  . Alcohol use No     Allergies   Patient has no known allergies.   Review of Systems Review of Systems  Constitutional: Negative for chills and fever.  Skin: Positive for color change and rash. Negative for wound.  All other systems reviewed and are negative.    Physical Exam Updated Vital Signs BP 125/82 (BP Location: Right Arm)   Pulse 77   Temp 98.6 F (37 C) (Oral)   Resp 18   Ht 5\' 6"  (1.676 m)   Wt 157 lb (71.2 kg)   LMP 01/24/2017   SpO2 100%   BMI 25.34 kg/m   Physical Exam  Constitutional: She is oriented to person, place, and time. She appears well-developed and well-nourished.  HENT:  Head: Normocephalic.  Eyes: EOM are normal.  Neck: Normal range of motion.  Pulmonary/Chest: Effort normal.  Abdominal: She exhibits no distension.  Musculoskeletal: Normal range of motion.  Neurological: She is alert and oriented to person, place, and time.  Skin: Skin is dry. Rash noted.  There is erythema.  Single erythematous papule to the R mid thigh with mild surrounding erythema, no fluctuance.  Psychiatric: She has a normal mood and affect.  Nursing note and vitals reviewed.    ED Treatments / Results  DIAGNOSTIC STUDIES: Oxygen Saturation is 100% on RA, NL by my interpretation.    COORDINATION OF CARE: 7:10 PM-Discussed next steps with pt. Pt verbalized understanding and is agreeable with the plan. Will Rx medications. Pt prepared for d/c, advised of symptomatic care at home and return precautions.    Labs (all labs ordered are listed, but only abnormal results are displayed) Labs Reviewed - No data to display  EKG   EKG Interpretation None       Radiology No results found.  Procedures Procedures (including critical care time)  Medications Ordered in ED Medications - No data to display   Initial Impression / Assessment and Plan / ED Course  I have reviewed the triage vital signs and the nursing notes.  Pertinent labs & imaging results that were available during my care of the patient were reviewed by me and considered in my medical decision making (see chart for details).     Single erythematous papule to the thigh.  Appears c/w insect bite vs early developing abscess.  Not drainable at present.  Afebrile.  Leading edge of erythema marked by me and pt agrees to warm compresses and abx.  Return precautions discussed  Final Clinical Impressions(s) / ED Diagnoses   Final diagnoses:  Rash/skin eruption    New Prescriptions Discharge Medication List as of 01/24/2017  7:25 PM    START taking these medications   Details  mupirocin nasal ointment (BACTROBAN) 2 % Apply to affected area TID x 10 days, Print    sulfamethoxazole-trimethoprim (BACTRIM DS,SEPTRA DS) 800-160 MG tablet Take 1 tablet by mouth 2 (two) times daily., Starting Mon 01/24/2017, Until Mon 01/31/2017, Print       I personally performed the services described in this documentation, which was scribed in my presence. The recorded information has been reviewed and is accurate.    Kem Parkinson, PA-C 01/29/17 1046    Milton Ferguson, MD 01/31/17 1453

## 2017-01-24 NOTE — Discharge Instructions (Signed)
Warm wet soaks or compresses on/off to the affected area.  Return here for any signs of increasing infection.  Take ibuprofen 600 mg 3 times a day for pain

## 2017-03-01 ENCOUNTER — Ambulatory Visit: Payer: Medicaid Other

## 2017-03-01 ENCOUNTER — Encounter: Payer: Self-pay | Admitting: Obstetrics & Gynecology

## 2017-03-01 ENCOUNTER — Ambulatory Visit (INDEPENDENT_AMBULATORY_CARE_PROVIDER_SITE_OTHER): Payer: Medicaid Other | Admitting: Obstetrics & Gynecology

## 2017-03-01 VITALS — BP 90/60 | HR 74 | Wt 162.0 lb

## 2017-03-01 DIAGNOSIS — Z3009 Encounter for other general counseling and advice on contraception: Secondary | ICD-10-CM

## 2017-03-01 MED ORDER — DESOGESTREL-ETHINYL ESTRADIOL 0.15-30 MG-MCG PO TABS: 1.0000 | ORAL_TABLET | Freq: Every day | ORAL | 11 refills | Status: DC

## 2017-03-01 NOTE — Progress Notes (Signed)
.   Subjective:    Joan Rose is a 21 y.o. female who presents for contraception counseling. The patient has no complaints today. The patient is sexually active. Pertinent past medical history: sexually transmitted diseases., chlamydia x 1 no sequalea  Menstrual History: OB History    Gravida Para Term Preterm AB Living   1 1   1   1    SAB TAB Ectopic Multiple Live Births         0 1      Menarche age: 37 No LMP recorded. Patient has had an injection.    The following portions of the patient's history were reviewed and updated as appropriate: allergies, current medications, past family history, past medical history, past social history, past surgical history and problem list.  Review of Systems Pertinent items are noted in HPI.  No burning with urination, frequency or urgency No nausea, vomiting or diarrhea Nor fever chills or other constitutional symptoms  Objective:    No exam performed today, pt just had yearly in 11/2016.   Assessment:    22 y.o., starting OCP (estrogen/progesterone), stopping Depo Provera due to emotional hanges no contraindications.   Plan:       Meds ordered this encounter  Medications  . desogestrel-ethinyl estradiol (APRI,EMOQUETTE,SOLIA) 0.15-30 MG-MCG tablet    Sig: Take 1 tablet by mouth daily.    Dispense:  1 Package    Refill:  11   Return in about 1 year (around 03/01/2018) for yearly.    Face to face time:  15 minutes  Greater than 50% of the visit time was spent in counseling and coordination of care with the patient.  The summary and outline of the counseling and care coordination is summarized in the note above.   All questions were answered.

## 2017-05-24 ENCOUNTER — Ambulatory Visit: Payer: Medicaid Other | Admitting: Advanced Practice Midwife

## 2017-11-15 ENCOUNTER — Encounter: Payer: Self-pay | Admitting: Adult Health

## 2017-11-15 ENCOUNTER — Ambulatory Visit (INDEPENDENT_AMBULATORY_CARE_PROVIDER_SITE_OTHER): Payer: Medicaid Other | Admitting: Adult Health

## 2017-11-15 ENCOUNTER — Other Ambulatory Visit (HOSPITAL_COMMUNITY)
Admission: RE | Admit: 2017-11-15 | Discharge: 2017-11-15 | Disposition: A | Discharge status: Home / Self Care | Payer: Self-pay | Source: Ambulatory Visit | Attending: Adult Health | Admitting: Adult Health

## 2017-11-15 VITALS — BP 120/80 | HR 78 | Ht 66.0 in | Wt 149.0 lb

## 2017-11-15 DIAGNOSIS — Z309 Encounter for contraceptive management, unspecified: Secondary | ICD-10-CM

## 2017-11-15 DIAGNOSIS — Z113 Encounter for screening for infections with a predominantly sexual mode of transmission: Secondary | ICD-10-CM | POA: Insufficient documentation

## 2017-11-15 DIAGNOSIS — Z3009 Encounter for other general counseling and advice on contraception: Secondary | ICD-10-CM

## 2017-11-15 DIAGNOSIS — Z30011 Encounter for initial prescription of contraceptive pills: Secondary | ICD-10-CM | POA: Insufficient documentation

## 2017-11-15 DIAGNOSIS — Z01419 Encounter for gynecological examination (general) (routine) without abnormal findings: Secondary | ICD-10-CM

## 2017-11-15 MED ORDER — DESOGESTREL-ETHINYL ESTRADIOL 0.15-30 MG-MCG PO TABS: 1.0000 | ORAL_TABLET | Freq: Every day | ORAL | 11 refills | Status: DC

## 2017-11-15 NOTE — Progress Notes (Signed)
Patient ID: Joan Rose, female   DOB: 09/04/1996, 22 y.o.   MRN: [id number] History of Present Illness:  Joan Rose is a 22 year old white female, G1P1 in for well woman gyn exam and pap, she has Family planning Medicaid.She uses condoms, but wants to resume OCs.   Current Medications, Allergies, Past Medical History, Past Surgical History, Family History and Social History were reviewed in [employer] record.     Review of Systems:  Patient denies any headaches, hearing loss, fatigue, blurred vision, shortness of breath, chest pain, abdominal pain, problems with bowel movements, urination, or intercourse. No joint pain or mood swings.   Physical Exam:BP 120/80 (BP Location: Left Arm, Patient Position: Sitting, Cuff Size: Normal)   Pulse 78   Ht 5\' 6"  (1.676 m)   Wt 149 lb (67.6 kg)   LMP 11/12/2017 (Approximate)   BMI 24.05 kg/m  General:  Well developed, well nourished, no acute distress Skin:  Warm and dry Neck:  Midline trachea, normal thyroid, good ROM, no lymphadenopathy Lungs; Clear to auscultation bilaterally Breast:  No dominant palpable mass, retraction, or nipple discharge Cardiovascular: Regular rate and rhythm Abdomen:  Soft, non tender, no hepatosplenomegaly Pelvic:  External genitalia is normal in appearance, no lesions.  The vagina is normal in appearance. Urethra has no lesions or masses. The cervix is bulbous. Pap with GC/CHL performed. Uterus is felt to be normal size, shape, and contour.  No adnexal masses or tenderness noted.Bladder is non tender, no masses felt. Extremities/musculoskeletal:  No swelling or varicosities noted, no clubbing or cyanosis Psych:  No mood changes, alert and cooperative,seems happy PHQ 2 score 0.   Impression:  1. Encounter for gynecological examination with Papanicolaou smear of cervix   2. Family planning   3. Screening examination for STD (sexually transmitted disease)   4. Encounter for initial prescription  of contraceptive pills      Plan: Check HIV and RPR Meds ordered this encounter  Medications  . desogestrel-ethinyl estradiol (APRI,EMOQUETTE,SOLIA) 0.15-30 MG-MCG tablet    Sig: Take 1 tablet by mouth daily.    Dispense:  1 Package    Refill:  11    Order Specific Question:   Supervising Provider    Answer:   Tania Ade H [[id number]]  Can start OCs today, and use condoms Physical in 1 year Pap in 3 if normal

## 2017-11-16 LAB — RPR: RPR: NONREACTIVE

## 2017-11-16 LAB — HIV ANTIBODY (ROUTINE TESTING W REFLEX): HIV SCREEN 4TH GENERATION: NONREACTIVE

## 2017-11-18 LAB — CYTOLOGY - PAP
Chlamydia: NEGATIVE
Diagnosis: NEGATIVE
Neisseria Gonorrhea: NEGATIVE

## 2019-04-10 ENCOUNTER — Telehealth: Payer: Self-pay | Admitting: Adult Health

## 2019-04-10 NOTE — Telephone Encounter (Signed)

## 2019-04-11 ENCOUNTER — Encounter: Payer: Self-pay | Admitting: Adult Health

## 2019-04-11 ENCOUNTER — Ambulatory Visit (INDEPENDENT_AMBULATORY_CARE_PROVIDER_SITE_OTHER): Payer: Medicaid Other | Admitting: Adult Health

## 2019-04-11 ENCOUNTER — Other Ambulatory Visit: Payer: Self-pay

## 2019-04-11 VITALS — BP 112/71 | HR 72 | Ht 66.0 in | Wt 164.0 lb

## 2019-04-11 DIAGNOSIS — Z0000 Encounter for general adult medical examination without abnormal findings: Secondary | ICD-10-CM

## 2019-04-11 DIAGNOSIS — Z3009 Encounter for other general counseling and advice on contraception: Secondary | ICD-10-CM

## 2019-04-11 DIAGNOSIS — Z113 Encounter for screening for infections with a predominantly sexual mode of transmission: Secondary | ICD-10-CM | POA: Insufficient documentation

## 2019-04-11 DIAGNOSIS — Z01419 Encounter for gynecological examination (general) (routine) without abnormal findings: Secondary | ICD-10-CM | POA: Insufficient documentation

## 2019-04-11 NOTE — Addendum Note (Signed)
Addended by: Linton Rump on: 04/11/2019 11:20 AM   Modules accepted: Orders

## 2019-04-11 NOTE — Progress Notes (Signed)
Patient ID: Joan Rose, female   DOB: 08/18/1996, 23 y.o.   MRN: [id number] History of Present Illness: Joan Rose is a 23 year old black female, single,G1P0101, if for family planning well woman gyn exam, she had a normal pap 11/15/17. She works at [employer]. PCP is [facility].    Current Medications, Allergies, Past Medical History, Past Surgical History, Family History and Social History were reviewed in [employer] record.     Review of Systems: Patient denies any headaches, hearing loss, fatigue, blurred vision, shortness of breath, chest pain, abdominal pain, problems with bowel movements, urination, or intercourse. No joint pain or mood swings. She is not using birth control and is [place] if gets pregnant.     Physical Exam:BP 112/71 (BP Location: Left Arm, Patient Position: Sitting, Cuff Size: Normal)   Pulse 72   Ht 5\' 6"  (1.676 m)   Wt 164 lb (74.4 kg)   LMP 03/28/2019   BMI 26.47 kg/m  General:  Well developed, well nourished, no acute distress Skin:  Warm and dry Neck:  Midline trachea, normal thyroid, good ROM, no lymphadenopathy Lungs; Clear to auscultation bilaterally Breast:  No dominant palpable mass, retraction, or nipple discharge Cardiovascular: Regular rate and rhythm Abdomen:  Soft, non tender, no hepatosplenomegaly Pelvic:  External genitalia is normal in appearance, no lesions.  The vagina is normal in appearance. Urethra has no lesions or masses. The cervix is bulbous.  Uterus is felt to be normal size, shape, and contour.  No adnexal masses or tenderness noted.Bladder is non tender, no masses felt.GC/CHL obtained. Extremities/musculoskeletal:  No swelling or varicosities noted, no clubbing or cyanosis Psych:  No mood changes, alert and cooperative,seems happy Fall risk is low. PHQ 2 score is 0. Examination chaperoned by Levy Pupa LPN.  Impression: 1. Encounter for well woman exam with routine gynecological exam   2. Family planning   3.  Screening examination for STD (sexually transmitted disease)       Plan: GC/CHL sent Check HIV and RPR Physical in 1 year Pap in 2022.

## 2019-04-12 LAB — HIV ANTIBODY (ROUTINE TESTING W REFLEX): HIV Screen 4th Generation wRfx: NONREACTIVE

## 2019-04-12 LAB — RPR: RPR Ser Ql: NONREACTIVE

## 2019-04-17 ENCOUNTER — Telehealth: Payer: Self-pay | Admitting: *Deleted

## 2019-04-17 LAB — GC/CHLAMYDIA PROBE AMP
Chlamydia trachomatis, NAA: NEGATIVE
Neisseria Gonorrhoeae by PCR: NEGATIVE

## 2019-04-17 LAB — SPECIMEN STATUS REPORT

## 2019-04-17 NOTE — Telephone Encounter (Signed)
-----   Message from Estill Dooms, NP sent at 04/17/2019  8:41 AM EDT ----- Let Holli Humbles know both negative

## 2019-04-17 NOTE — Telephone Encounter (Signed)
Pt advised GC/CHL both are negative. Pt voiced understanding. [place]

## 2019-06-14 ENCOUNTER — Other Ambulatory Visit: Payer: Self-pay

## 2019-06-14 DIAGNOSIS — Z20822 Contact with and (suspected) exposure to covid-19: Secondary | ICD-10-CM

## 2019-06-15 LAB — NOVEL CORONAVIRUS, NAA: SARS-CoV-2, NAA: NOT DETECTED

## 2019-07-31 ENCOUNTER — Other Ambulatory Visit: Payer: Self-pay

## 2019-07-31 DIAGNOSIS — Z20822 Contact with and (suspected) exposure to covid-19: Secondary | ICD-10-CM

## 2019-08-01 LAB — NOVEL CORONAVIRUS, NAA: SARS-CoV-2, NAA: NOT DETECTED

## 2019-09-03 ENCOUNTER — Other Ambulatory Visit: Payer: Self-pay | Admitting: Women's Health

## 2019-09-03 ENCOUNTER — Telehealth: Payer: Self-pay | Admitting: *Deleted

## 2019-09-03 MED ORDER — FLUCONAZOLE 150 MG PO TABS: 150.0000 mg | ORAL_TABLET | Freq: Once | ORAL | 0 refills | Status: AC

## 2019-09-03 NOTE — Telephone Encounter (Signed)
Pt left message that she is on antibiotics and now has a yeast infection.

## 2020-01-08 ENCOUNTER — Ambulatory Visit: Payer: Medicaid Other | Admitting: Adult Health

## 2020-04-11 ENCOUNTER — Encounter: Payer: Self-pay | Admitting: Adult Health

## 2020-04-11 ENCOUNTER — Other Ambulatory Visit (HOSPITAL_COMMUNITY)
Admission: RE | Admit: 2020-04-11 | Discharge: 2020-04-11 | Disposition: A | Discharge status: Home / Self Care | Payer: Medicaid Other | Source: Ambulatory Visit | Attending: Adult Health | Admitting: Adult Health

## 2020-04-11 ENCOUNTER — Ambulatory Visit (INDEPENDENT_AMBULATORY_CARE_PROVIDER_SITE_OTHER): Payer: Medicaid Other | Admitting: Adult Health

## 2020-04-11 VITALS — BP 112/75 | HR 76 | Ht 66.0 in | Wt 167.0 lb

## 2020-04-11 DIAGNOSIS — Z113 Encounter for screening for infections with a predominantly sexual mode of transmission: Secondary | ICD-10-CM

## 2020-04-11 DIAGNOSIS — Z01419 Encounter for gynecological examination (general) (routine) without abnormal findings: Secondary | ICD-10-CM

## 2020-04-11 DIAGNOSIS — Z3009 Encounter for other general counseling and advice on contraception: Secondary | ICD-10-CM | POA: Diagnosis present

## 2020-04-11 NOTE — Patient Instructions (Signed)
Use condoms Remember Plan B Take 657 mcg of folic acid daily

## 2020-04-11 NOTE — Progress Notes (Signed)
Patient ID: Joan Rose, female   DOB: [birth year], 24 y.o.   MRN: [id number] History of Present Illness: Joan Rose is a 24 year old black female, single, G1P0101, in for a well woman gyn exam,she had a normal pap 11/15/17. She works at [employer]  PCP is [facility].  Current Medications, Allergies, Past Medical History, Past Surgical History, Family History and Social History were reviewed in [employer] record.     Review of Systems: Patient denies any headaches, hearing loss, fatigue, blurred vision, shortness of breath, chest pain, abdominal pain, problems with bowel movements, urination, or intercourse. No joint pain or mood swings. She uses condoms and is happy with that.     Physical Exam:BP 112/75 (BP Location: Left Arm, Patient Position: Sitting, Cuff Size: Normal)   Pulse 76   Ht 5\' 6"  (1.676 m)   Wt 167 lb (75.8 kg)   LMP 03/28/2020 (Approximate)   BMI 26.95 kg/m  General:  Well developed, well nourished, no acute distress Skin:  Warm and dry Neck:  Midline trachea, normal thyroid, good ROM, no lymphadenopathy Lungs; Clear to auscultation bilaterally Breast:  No dominant palpable mass, retraction, or nipple discharge Cardiovascular: Regular rate and rhythm Abdomen:  Soft, non tender, no hepatosplenomegaly Pelvic:  External genitalia is normal in appearance, no lesions.  The vagina is normal in appearance. Urethra has no lesions or masses. The cervix is bulbous.  Uterus is felt to be normal size, shape, and contour.  No adnexal masses or tenderness noted.Bladder is non tender, no masses felt. CV swab obtained. Extremities/musculoskeletal:  No swelling or varicosities noted, no clubbing or cyanosis Psych:  No mood changes, alert and cooperative,seems happy AA is 1 Fall risk is low PHQ 9 score is 0  Upstream - 04/11/20 0840      Pregnancy Intention Screening   Does the patient want to become pregnant in the next year? Unsure    Does the patient's partner want  to become pregnant in the next year? Unsure    Would the patient like to discuss contraceptive options today? No      Contraception Wrap Up   Current Method Female Condom    End Method Female Condom    Contraception Counseling Provided Yes          Examination chaperoned by Marcie Bal young LPN  Impression and Plan: 1. Encounter for well woman exam with routine gynecological exam Pap and physical in 1 year   2. Family planning Use condoms  Discussed Plan B Take folic acid 144 mcg daily  CV swab sent for GC/CHL and trich Check HIV,RPR and hepatitis C antibody  3. Screening examination for STD (sexually transmitted disease CV swab sent for GC/CHL and trich Check HIV,RPR and hepatitis C antibody

## 2020-04-12 LAB — HIV ANTIBODY (ROUTINE TESTING W REFLEX): HIV Screen 4th Generation wRfx: NONREACTIVE

## 2020-04-12 LAB — RPR: RPR Ser Ql: NONREACTIVE

## 2020-04-12 LAB — HEPATITIS C ANTIBODY: Hep C Virus Ab: <0.1 s/co ratio (ref 0.0–0.9)

## 2020-04-14 LAB — CERVICOVAGINAL ANCILLARY ONLY
Chlamydia: NEGATIVE
Comment: NEGATIVE
Comment: NEGATIVE
Comment: NORMAL
Neisseria Gonorrhea: NEGATIVE
Trichomonas: NEGATIVE

## 2020-09-09 ENCOUNTER — Ambulatory Visit
Admission: EM | Admit: 2020-09-09 | Discharge: 2020-09-09 | Discharge status: Absent without leave | Payer: Medicaid Other

## 2021-04-30 ENCOUNTER — Ambulatory Visit (INDEPENDENT_AMBULATORY_CARE_PROVIDER_SITE_OTHER): Payer: Medicaid Other | Admitting: Adult Health

## 2021-04-30 ENCOUNTER — Other Ambulatory Visit (HOSPITAL_COMMUNITY)
Admission: RE | Admit: 2021-04-30 | Discharge: 2021-04-30 | Disposition: A | Discharge status: Home / Self Care | Payer: Medicaid Other | Source: Ambulatory Visit | Attending: Adult Health | Admitting: Adult Health

## 2021-04-30 ENCOUNTER — Other Ambulatory Visit: Payer: Self-pay

## 2021-04-30 ENCOUNTER — Encounter: Payer: Self-pay | Admitting: Adult Health

## 2021-04-30 VITALS — BP 109/73 | HR 67 | Ht 66.0 in | Wt 167.5 lb

## 2021-04-30 DIAGNOSIS — Z113 Encounter for screening for infections with a predominantly sexual mode of transmission: Secondary | ICD-10-CM

## 2021-04-30 DIAGNOSIS — Z0000 Encounter for general adult medical examination without abnormal findings: Secondary | ICD-10-CM

## 2021-04-30 DIAGNOSIS — Z01419 Encounter for gynecological examination (general) (routine) without abnormal findings: Secondary | ICD-10-CM

## 2021-04-30 DIAGNOSIS — Z3009 Encounter for other general counseling and advice on contraception: Secondary | ICD-10-CM | POA: Insufficient documentation

## 2021-04-30 NOTE — Progress Notes (Signed)
Patient ID: Joan Rose, female   DOB: 09-19-1995, 25 y.o.   MRN: [id number] History of Present Illness: Feleshia is a 25 year old black female,single, G1P0101, in for a well woman gyn exam and pap. She is still working at [employer] and her daughter is in kindergarten at [employer]. PCP is [facility].   Current Medications, Allergies, Past Medical History, Past Surgical History, Family History and Social History were reviewed in [employer] record.     Review of Systems:  Patient denies any headaches, hearing loss, fatigue, blurred vision, shortness of breath, chest pain, abdominal pain, problems with bowel movements, urination, or intercourse. No joint pain or mood swings.    Physical Exam:BP 109/73 (BP Location: Left Arm, Patient Position: Sitting, Cuff Size: Normal)   Pulse 67   Ht '5\' 6"'$  (1.676 m)   Wt 167 lb 8 oz (76 kg)   LMP 04/04/2021   BMI 27.04 kg/m   General:  Well developed, well nourished, no acute distress Skin:  Warm and dry Neck:  Midline trachea, normal thyroid, good ROM, no lymphadenopathy Lungs; Clear to auscultation bilaterally Breast:  No dominant palpable mass, retraction, or nipple discharge Cardiovascular: Regular rate and rhythm Abdomen:  Soft, non tender, no hepatosplenomegaly Pelvic:  External genitalia is normal in appearance, no lesions.  The vagina is normal in appearance. Urethra has no lesions or masses. The cervix is bulbous.Pap with HR HPV genotyping and GC/CHL performed.  Uterus is felt to be normal size, shape, and contour.  No adnexal masses or tenderness noted.Bladder is non tender, no masses felt. Rectal:Deferred Extremities/musculoskeletal:  No swelling or varicosities noted, no clubbing or cyanosis Psych:  No mood changes, alert and cooperative,seems happy AA is 1 Fall risk is low Depression screen [facility] 2/9 04/30/2021 04/11/2020 04/11/2019  Decreased Interest 0 0 0  Down, Depressed, Hopeless 0 0 0  PHQ - 2 Score 0 0 0  Altered sleeping  0 0 -  Tired, decreased energy 1 0 -  Change in appetite 0 0 -  Feeling bad or failure about yourself  0 0 -  Trouble concentrating 0 0 -  Moving slowly or fidgety/restless 0 0 -  Suicidal thoughts 0 0 -  PHQ-9 Score 1 0 -    GAD 7 : Generalized Anxiety Score 04/30/2021 04/11/2020  Nervous, Anxious, on Edge 0 0  Control/stop worrying 0 0  Worry too much - different things 0 0  Trouble relaxing 0 0  Restless 0 0  Easily annoyed or irritable 0 0  Afraid - awful might happen 0 0  Total GAD 7 Score 0 0      Upstream - 04/30/21 0839       Pregnancy Intention Screening   Does the patient want to become pregnant in the next year? Ok Either Way    Does the patient's partner want to become pregnant in the next year? Ok Either Way    Would the patient like to discuss contraceptive options today? No      Contraception Wrap Up   Current Method Female Condom    End Method Female Condom            Examination chaperoned by Levy Pupa LPN  Impression and plan: 1. Routine general medical examination at a health care facility Pap sent with GC/CHL and HR HPV genotyping   2. Family planning Check HIV and RPR  3. Encounter for gynecological examination with Papanicolaou smear of cervix Pap sent Physical in 1 year  Pap in 3 if normal  4. Screening examination for STD (sexually transmitted disease) Check HIV and RPR

## 2021-05-01 LAB — RPR: RPR Ser Ql: NONREACTIVE

## 2021-05-01 LAB — HIV ANTIBODY (ROUTINE TESTING W REFLEX): HIV Screen 4th Generation wRfx: NONREACTIVE

## 2021-05-07 LAB — CYTOLOGY - PAP
Chlamydia: NEGATIVE
Comment: NEGATIVE
Comment: NEGATIVE
Comment: NEGATIVE
Comment: NORMAL
HPV 16: NEGATIVE
HPV 18 / 45: NEGATIVE
High risk HPV: POSITIVE — AB
Neisseria Gonorrhea: NEGATIVE

## 2021-05-08 ENCOUNTER — Encounter: Payer: Self-pay | Admitting: Adult Health

## 2021-05-08 DIAGNOSIS — R87612 Low grade squamous intraepithelial lesion on cytologic smear of cervix (LGSIL): Secondary | ICD-10-CM

## 2021-05-08 HISTORY — DX: Low grade squamous intraepithelial lesion on cytologic smear of cervix (LGSIL): R87.612

## 2021-05-11 ENCOUNTER — Telehealth: Payer: Self-pay | Admitting: Adult Health

## 2021-05-11 NOTE — Telephone Encounter (Signed)
Pt aware of pap results and colpo appt made.

## 2021-05-25 ENCOUNTER — Other Ambulatory Visit (HOSPITAL_COMMUNITY)
Admission: RE | Admit: 2021-05-25 | Discharge: 2021-05-25 | Disposition: A | Discharge status: Home / Self Care | Payer: Medicaid Other | Source: Ambulatory Visit | Attending: Obstetrics & Gynecology | Admitting: Obstetrics & Gynecology

## 2021-05-25 ENCOUNTER — Ambulatory Visit (INDEPENDENT_AMBULATORY_CARE_PROVIDER_SITE_OTHER): Payer: Self-pay | Admitting: Obstetrics & Gynecology

## 2021-05-25 ENCOUNTER — Other Ambulatory Visit: Payer: Self-pay

## 2021-05-25 ENCOUNTER — Encounter: Payer: Self-pay | Admitting: Obstetrics & Gynecology

## 2021-05-25 VITALS — BP 129/75 | HR 96 | Ht 66.0 in | Wt 167.2 lb

## 2021-05-25 DIAGNOSIS — Z3202 Encounter for pregnancy test, result negative: Secondary | ICD-10-CM

## 2021-05-25 DIAGNOSIS — R87612 Low grade squamous intraepithelial lesion on cytologic smear of cervix (LGSIL): Secondary | ICD-10-CM | POA: Diagnosis present

## 2021-05-25 DIAGNOSIS — R87810 Cervical high risk human papillomavirus (HPV) DNA test positive: Secondary | ICD-10-CM

## 2021-05-25 LAB — POCT URINE PREGNANCY: Preg Test, Ur: NEGATIVE

## 2021-05-25 NOTE — Progress Notes (Signed)
    Patient name: Joan Rose MRN [id number]  Date of birth: 10-26-1995 Chief Complaint:   Colposcopy (LSIL +HR HPV on Pap )  History of Present Illness:   Joan Rose is a 25 y.o. G108P0101  female being seen today for cervical dysplasia management.  Smoker:  No. New sexual partner:  Yes.    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  Would be excited about a pregnancy at this time.  Patient's last menstrual period was 05/03/2021 (exact date).  [facility] 04/30/2021 04/11/2020 04/11/2019 11/15/2017 11/11/2016  Decreased Interest 0 0 0 0 0  Down, Depressed, Hopeless 0 0 0 0 0  PHQ - 2 Score 0 0 0 0 0  Altered sleeping 0 0 - - -  Tired, decreased energy 1 0 - - -  Change in appetite 0 0 - - -  Feeling bad or failure about yourself  0 0 - - -  Trouble concentrating 0 0 - - -  Moving slowly or fidgety/restless 0 0 - - -  Suicidal thoughts 0 0 - - -  PHQ-9 Score 1 0 - - -     Review of Systems:   Pertinent items are noted in HPI Denies fever/chills, dizziness, headaches, visual disturbances, fatigue, shortness of breath, chest pain, abdominal pain, vomiting, no problems with periods, bowel movements, urination, or intercourse unless otherwise stated above.  Pertinent History Reviewed:  Reviewed past medical,surgical, social, obstetrical and family history.  Reviewed problem list, medications and allergies. Physical Assessment:   Vitals:   05/25/21 1459  BP: 129/75  Pulse: 96  Weight: 167 lb 3.2 oz (75.8 kg)  Height: '5\' 6"'$  (1.676 m)  Body mass index is 26.99 kg/m.       Physical Examination:   General appearance: alert, well appearing, and in no distress  Psych: mood appropriate, normal affect  Skin: warm & dry   Cardiovascular: normal heart rate noted  Respiratory: normal respiratory effort, no distress  Abdomen: soft, non-tender   Pelvic: VULVA: normal  appearing vulva with no masses, tenderness or lesions, VAGINA: normal appearing vagina with normal color and discharge, no lesions, CERVIX: see colposcopy section  Extremities: no edema   Chaperone: ACelene Squibb    Colposcopy Procedure Note  Indications: LSIL, HPV+   Procedure Details  The risks and benefits of the procedure and Written informed consent obtained.  Speculum placed in vagina and excellent visualization of cervix achieved, cervix swabbed x 3 with acetic acid solution.  Findings: Adequate colposcopy is noted today.  Cervix: acetowhite lesion(s) noted at entire TMZ and mosaicism noted at 4 and 12 o'clock; endocervical curettage performed and cervical biopsies taken at 4, 7, 2 and 10 o'clock.  Monsel's applied.   Specimens: ECC and cervical biopsies  Complications: none.  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 Further management pending results Reviewed postprocedure expectation/recovery

## 2021-05-27 LAB — SURGICAL PATHOLOGY

## 2022-10-29 ENCOUNTER — Ambulatory Visit (INDEPENDENT_AMBULATORY_CARE_PROVIDER_SITE_OTHER): Payer: Medicaid Other | Admitting: Adult Health

## 2022-10-29 ENCOUNTER — Encounter: Payer: Self-pay | Admitting: Adult Health

## 2022-10-29 ENCOUNTER — Other Ambulatory Visit (HOSPITAL_COMMUNITY)
Admission: RE | Admit: 2022-10-29 | Discharge: 2022-10-29 | Disposition: A | Discharge status: Home / Self Care | Payer: Medicaid Other | Source: Ambulatory Visit | Attending: Adult Health | Admitting: Adult Health

## 2022-10-29 VITALS — BP 124/77 | HR 73 | Ht 66.0 in | Wt 181.0 lb

## 2022-10-29 DIAGNOSIS — R87612 Low grade squamous intraepithelial lesion on cytologic smear of cervix (LGSIL): Secondary | ICD-10-CM

## 2022-10-29 DIAGNOSIS — Z319 Encounter for procreative management, unspecified: Secondary | ICD-10-CM

## 2022-10-29 DIAGNOSIS — Z0000 Encounter for general adult medical examination without abnormal findings: Secondary | ICD-10-CM | POA: Insufficient documentation

## 2022-10-29 DIAGNOSIS — Z01419 Encounter for gynecological examination (general) (routine) without abnormal findings: Secondary | ICD-10-CM

## 2022-10-29 NOTE — Progress Notes (Signed)
Patient ID: Joan Rose, female   DOB: Jan 06, 1996, 27 y.o.   MRN: [id number] History of Present Illness: Joan Rose is a 27 year old black female,single, G1P0101, in for a well woman gyn exam and pap. 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 She says periods are regular, and has been trying about a year to get pregnant.  PCP is [facility].   Current Medications, Allergies, Past Medical History, Past Surgical History, Family History and Social History were reviewed in [employer] record.     Review of Systems: Patient denies any headaches, hearing loss, fatigue, blurred vision, shortness of breath, chest pain, abdominal pain, problems with bowel movements, urination, or intercourse. No joint pain or mood swings.  Periods regular   Physical Exam:BP 124/77 (BP Location: Left Arm, Patient Position: Sitting, Cuff Size: Normal)   Pulse 73   Ht 5' 6"$  (1.676 m)   Wt 181 lb (82.1 kg)   LMP 10/23/2022   BMI 29.21 kg/m   General:  Well developed, well nourished, no acute distress Skin:  Warm and dry Neck:  Midline trachea, normal thyroid, good ROM, no lymphadenopathy Lungs; Clear to auscultation bilaterally Breast:  No dominant palpable mass, retraction, or nipple discharge Cardiovascular: Regular rate and rhythm Abdomen:  Soft, non tender, no hepatosplenomegaly Pelvic:  External genitalia is normal in appearance, no lesions.  The vagina is normal in appearance. Urethra has no lesions or masses. The cervix is bulbous. Pap with HR HPV genotyping performed. Uterus is felt to be normal size, shape, and contour.  No adnexal masses or tenderness noted.Bladder is non tender, no masses felt. Extremities/musculoskeletal:  No swelling or varicosities noted, no clubbing or cyanosis Psych:  No mood changes, alert and cooperative,seems happy AA is 1 Fall risk is low    10/29/2022    8:34 AM 04/30/2021    8:43 AM 04/11/2020    8:39 AM  Depression screen PHQ 2/9  Decreased Interest 0 0 0  Down, Depressed,  Hopeless 0 0 0  PHQ - 2 Score 0 0 0  Altered sleeping 0 0 0  Tired, decreased energy 1 1 0  Change in appetite 0 0 0  Feeling bad or failure about yourself  0 0 0  Trouble concentrating 0 0 0  Moving slowly or fidgety/restless 0 0 0  Suicidal thoughts 0 0 0  PHQ-9 Score 1 1 0       10/29/2022    8:34 AM 04/30/2021    8:43 AM 04/11/2020    8:40 AM  GAD 7 : Generalized Anxiety Score  Nervous, Anxious, on Edge 0 0 0  Control/stop worrying 0 0 0  Worry too much - different things 0 0 0  Trouble relaxing 0 0 0  Restless 0 0 0  Easily annoyed or irritable 0 0 0  Afraid - awful might happen 0 0 0  Total GAD 7 Score 0 0 0      Upstream - 10/29/22 0841       Pregnancy Intention Screening   Does the patient want to become pregnant in the next year? Yes    Does the patient's partner want to become pregnant in the next year? Yes    Would the patient like to discuss contraceptive options today? No      Contraception Wrap Up   Current Method Pregnant/Seeking Pregnancy    End Method Pregnant/Seeking Pregnancy            Examination chaperoned by Levy Pupa LPN   Impression  and Plan: 1. Routine general medical examination at a health care facility Pap sent Pap in 3 years if normal Physical in 1 year  - Cytology - PAP( [facility])  2. LGSIL on Pap smear of cervix Pap sent  3. Patient desires pregnancy Take OTC PNV with folic acid Discussed timing of sex Will check progesterone level day 21, which is 11/12/22 - Progesterone Did discuss getting semen analysis if desired  4. Encounter for routine gynecological examination with Papanicolaou smear of cervix Pap sent Physical in 1 year

## 2022-11-03 ENCOUNTER — Telehealth: Payer: Self-pay

## 2022-11-03 LAB — CYTOLOGY - PAP
Comment: NEGATIVE
Diagnosis: NEGATIVE
High risk HPV: NEGATIVE

## 2022-11-03 NOTE — Telephone Encounter (Signed)
Mychart msg sent. AS, CMA

## 2022-11-12 DIAGNOSIS — Z319 Encounter for procreative management, unspecified: Secondary | ICD-10-CM | POA: Diagnosis not present

## 2022-11-13 LAB — PROGESTERONE: Progesterone: 1.9 ng/mL

## 2022-11-15 ENCOUNTER — Telehealth: Payer: Self-pay | Admitting: Adult Health

## 2022-11-15 NOTE — Telephone Encounter (Signed)
Patient does want to start on medicine to help ovulate. Please advise.

## 2022-11-16 MED ORDER — PRENATAL PLUS 27-1 MG PO TABS: 1.0000 | ORAL_TABLET | Freq: Every day | ORAL | 12 refills | Status: DC

## 2022-11-16 MED ORDER — CLOMIPHENE CITRATE 50 MG PO TABS: 50.0000 mg | ORAL_TABLET | Freq: Every day | ORAL | 2 refills | Status: DC

## 2022-11-16 NOTE — Addendum Note (Signed)
Addended by: Derrek Monaco A on: 11/16/2022 05:15 PM   Modules accepted: Orders

## 2022-11-16 NOTE — Telephone Encounter (Signed)
Pt wants to start clomid, will rx clomid and PNV, discussed timing of sex and will check progesterone level day 21 of cycle

## 2022-11-19 ENCOUNTER — Ambulatory Visit: Payer: Medicaid Other | Admitting: Family Medicine

## 2022-11-19 ENCOUNTER — Encounter: Payer: Self-pay | Admitting: Family Medicine

## 2022-11-19 VITALS — BP 124/77 | HR 87 | Ht 66.0 in | Wt 184.0 lb

## 2022-11-19 DIAGNOSIS — E785 Hyperlipidemia, unspecified: Secondary | ICD-10-CM

## 2022-11-19 DIAGNOSIS — R7301 Impaired fasting glucose: Secondary | ICD-10-CM

## 2022-11-19 DIAGNOSIS — E039 Hypothyroidism, unspecified: Secondary | ICD-10-CM

## 2022-11-19 DIAGNOSIS — R1084 Generalized abdominal pain: Secondary | ICD-10-CM | POA: Diagnosis not present

## 2022-11-19 DIAGNOSIS — D2330 Other benign neoplasm of skin of unspecified part of face: Secondary | ICD-10-CM | POA: Insufficient documentation

## 2022-11-19 DIAGNOSIS — R1031 Right lower quadrant pain: Secondary | ICD-10-CM | POA: Diagnosis not present

## 2022-11-19 NOTE — Assessment & Plan Note (Addendum)
CT abdomen pelvis ordered- will follow up awaiting results. Increase oral fluid intake. Bland diet as tolerated. Follow-up in unable to keep food/fluid down x 24 hours, dizziness, fevers, worsening or persistent symptoms to present to ED or contact primary care provider. Patient verbalizes understanding regarding plan of care and all questions answered.

## 2022-11-19 NOTE — Progress Notes (Signed)
New Patient Office Visit   Subjective   Patient ID: Joan Rose, female    DOB: Jan 11, 1996  Age: 27 y.o. MRN: [id number]  CC:  Chief Complaint  Patient presents with   Establish Care    Patient is establishing care today. She does mention having a knot on the right side of her cheek.    HPI Joan Rose 27 year old female,presents to establish care. 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  Abdominal Pain The current episode started more than 1 month ago. The problem occurs intermittently. The problem has been gradually worsening. The pain is located in the RLQ and RUQ. The pain is at a severity of 6/10. The pain is moderate. The quality of the pain is sharp. The abdominal pain radiates to the epigastric region, RLQ and RUQ. Pertinent negatives include no constipation, diarrhea, dysuria, fever, headaches, melena, myalgias, nausea or vomiting. The pain is aggravated by being still. The pain is relieved by Being still. She has tried nothing for the symptoms.    Outpatient Encounter Medications as of 11/19/2022  Medication Sig   clomiPHENE (CLOMID) 50 MG tablet Take 1 tablet (50 mg total) by mouth daily.   prenatal vitamin w/FE, FA (PRENATAL 1 + 1) 27-1 MG TABS tablet Take 1 tablet by mouth daily at 12 noon.   No facility-administered encounter medications on file as of 11/19/2022.    Past Surgical History:  Procedure Laterality Date   BREAST BIOPSY Right 09/30/2014   Procedure: BREAST BIOPSY;  Surgeon: Jamesetta So, MD;  Location: [facility];  Service: General;  Laterality: Right;    Review of Systems  Constitutional:  Negative for chills and fever.  HENT:  Negative for ear pain.   Eyes:  Negative for blurred vision.  Respiratory:  Negative for shortness of breath.   Cardiovascular:  Negative for chest pain.  Gastrointestinal:  Positive for abdominal pain. Negative for blood in stool,  constipation, diarrhea, heartburn, melena, nausea and vomiting.  Genitourinary:  Negative for dysuria.  Musculoskeletal:  Negative for myalgias.  Neurological:  Negative for dizziness and headaches.      Objective    BP 124/77 (BP Location: Right Arm, Patient Position: Sitting, Cuff Size: Normal)   Pulse 87   Ht '5\' 6"'$  (1.676 m)   Wt 184 lb (83.5 kg)   LMP 10/23/2022   SpO2 97%   BMI 29.70 kg/m   Physical Exam Cardiovascular:     Rate and Rhythm: Normal rate and regular rhythm.     Pulses: Normal pulses.     Heart sounds: Normal heart sounds.  Pulmonary:     Effort: Pulmonary effort is normal.     Breath sounds: Normal breath sounds.  Abdominal:     General: Bowel sounds are normal. There is no distension.     Palpations: Abdomen is soft. There is no mass.     Tenderness: There is no abdominal tenderness. There is no right CVA tenderness, left CVA tenderness, guarding or rebound. Negative signs include Murphy's sign and Rovsing's sign.  Musculoskeletal:        General: Normal range of motion.  Skin:    General: Skin is warm and dry.  Neurological:     Mental Status: She is alert.     Coordination: Coordination normal.     Gait: Gait normal.  Psychiatric:        Mood and Affect: Mood normal.  Assessment & Plan:  Generalized abdominal pain -     CT ABDOMEN PELVIS WO CONTRAST; Future  Hypothyroidism, unspecified type -     TSH + free T4  Hyperlipidemia, unspecified hyperlipidemia type -     Lipid panel -     CMP14+EGFR -     CBC with Differential/Platelet  IFG (impaired fasting glucose) -     Hemoglobin A1c  Dermoid cyst of face Assessment & Plan: Patient noticed painless right side inner facial lump on June 2023. Movable soft medium size cyst, no pus or drainage noted. Advise patient to use a warm compress over the cyst to reduce symptoms. Avoid trying to pop or drain cyst. Referral to Dermatology for further evaluation.  Orders: -     Ambulatory  referral to Dermatology  Abdominal pain, RLQ (right lower quadrant) Assessment & Plan: CT abdomen pelvis ordered- will follow up awaiting results. Increase oral fluid intake. Bland diet as tolerated. Follow-up in unable to keep food/fluid down x 24 hours, dizziness, fevers, worsening or persistent symptoms to present to ED or contact primary care provider. Patient verbalizes understanding regarding plan of care and all questions answered.      No follow-ups on file.   Renard Hamper Ria Comment, FNP

## 2022-11-19 NOTE — Patient Instructions (Signed)
It was pleasure meeting with you today. Follow up with your primary health provider if any health concerns arises. If symptoms worsen please contact your primary care provider and/or visit the emergency department.

## 2022-11-19 NOTE — Assessment & Plan Note (Addendum)
Patient noticed painless right side inner facial lump on June 2023. Movable soft medium size cyst, no pus or drainage noted. Advise patient to use a warm compress over the cyst to reduce symptoms. Avoid trying to pop or drain cyst. Referral to Dermatology for further evaluation.

## 2022-11-23 DIAGNOSIS — E785 Hyperlipidemia, unspecified: Secondary | ICD-10-CM | POA: Diagnosis not present

## 2022-11-23 DIAGNOSIS — R7301 Impaired fasting glucose: Secondary | ICD-10-CM | POA: Diagnosis not present

## 2022-11-23 DIAGNOSIS — E039 Hypothyroidism, unspecified: Secondary | ICD-10-CM | POA: Diagnosis not present

## 2022-11-24 LAB — LIPID PANEL
Chol/HDL Ratio: 2.6 ratio (ref 0.0–4.4)
Cholesterol, Total: 152 mg/dL (ref 100–199)
HDL: 59 mg/dL (ref >39)
LDL Chol Calc (NIH): 83 mg/dL (ref 0–99)
Triglycerides: 48 mg/dL (ref 0–149)
VLDL Cholesterol Cal: 10 mg/dL (ref 5–40)

## 2022-11-24 LAB — CBC WITH DIFFERENTIAL/PLATELET
Basophils Absolute: 0 10*3/uL (ref 0.0–0.2)
Basos: 0 %
EOS (ABSOLUTE): 0.4 10*3/uL (ref 0.0–0.4)
Eos: 4 %
Hematocrit: 35.6 % (ref 34.0–46.6)
Hemoglobin: 11.4 g/dL (ref 11.1–15.9)
Immature Grans (Abs): 0 10*3/uL (ref 0.0–0.1)
Immature Granulocytes: 0 %
Lymphocytes Absolute: 2.1 10*3/uL (ref 0.7–3.1)
Lymphs: 25 %
MCH: 26.3 pg — ABNORMAL LOW (ref 26.6–33.0)
MCHC: 32 g/dL (ref 31.5–35.7)
MCV: 82 fL (ref 79–97)
Monocytes Absolute: 0.7 10*3/uL (ref 0.1–0.9)
Monocytes: 9 %
Neutrophils Absolute: 5 10*3/uL (ref 1.4–7.0)
Neutrophils: 62 %
Platelets: 243 10*3/uL (ref 150–450)
RBC: 4.33 x10E6/uL (ref 3.77–5.28)
RDW: 12.6 % (ref 11.7–15.4)
WBC: 8.2 10*3/uL (ref 3.4–10.8)

## 2022-11-24 LAB — CMP14+EGFR
ALT: 11 IU/L (ref 0–32)
AST: 11 IU/L (ref 0–40)
Albumin/Globulin Ratio: 1.4 (ref 1.2–2.2)
Albumin: 4.1 g/dL (ref 4.0–5.0)
Alkaline Phosphatase: 61 IU/L (ref 44–121)
BUN/Creatinine Ratio: 16 (ref 9–23)
BUN: 12 mg/dL (ref 6–20)
Bilirubin Total: 0.6 mg/dL (ref 0.0–1.2)
CO2: 21 mmol/L (ref 20–29)
Calcium: 9 mg/dL (ref 8.7–10.2)
Chloride: 104 mmol/L (ref 96–106)
Creatinine, Ser: 0.73 mg/dL (ref 0.57–1.00)
Globulin, Total: 3 g/dL (ref 1.5–4.5)
Glucose: 92 mg/dL (ref 70–99)
Potassium: 4.5 mmol/L (ref 3.5–5.2)
Sodium: 138 mmol/L (ref 134–144)
Total Protein: 7.1 g/dL (ref 6.0–8.5)
eGFR: 115 mL/min/{1.73_m2} (ref >59)

## 2022-11-24 LAB — HEMOGLOBIN A1C
Est. average glucose Bld gHb Est-mCnc: 105 mg/dL
Hgb A1c MFr Bld: 5.3 % (ref 4.8–5.6)

## 2022-11-24 LAB — TSH+FREE T4
Free T4: 0.97 ng/dL (ref 0.82–1.77)
TSH: 1.35 u[IU]/mL (ref 0.450–4.500)

## 2022-12-01 ENCOUNTER — Other Ambulatory Visit (HOSPITAL_BASED_OUTPATIENT_CLINIC_OR_DEPARTMENT_OTHER): Payer: Medicaid Other

## 2022-12-03 ENCOUNTER — Other Ambulatory Visit: Payer: Self-pay | Admitting: Adult Health

## 2022-12-03 DIAGNOSIS — Z319 Encounter for procreative management, unspecified: Secondary | ICD-10-CM

## 2022-12-03 NOTE — Progress Notes (Signed)
Ck progesterone 12/16/22

## 2022-12-06 ENCOUNTER — Encounter: Payer: Self-pay | Admitting: Family Medicine

## 2022-12-16 DIAGNOSIS — Z319 Encounter for procreative management, unspecified: Secondary | ICD-10-CM | POA: Diagnosis not present

## 2022-12-17 LAB — PROGESTERONE: Progesterone: 6.9 ng/mL

## 2023-03-07 ENCOUNTER — Encounter: Payer: Self-pay | Admitting: Dermatology

## 2023-03-07 ENCOUNTER — Ambulatory Visit: Payer: Medicaid Other | Admitting: Dermatology

## 2023-03-07 VITALS — BP 116/80

## 2023-03-07 DIAGNOSIS — L720 Epidermal cyst: Secondary | ICD-10-CM | POA: Diagnosis not present

## 2023-03-07 DIAGNOSIS — L700 Acne vulgaris: Secondary | ICD-10-CM

## 2023-03-07 DIAGNOSIS — L729 Follicular cyst of the skin and subcutaneous tissue, unspecified: Secondary | ICD-10-CM

## 2023-03-07 MED ORDER — CLINDAMYCIN PHOSPHATE 1 % EX SWAB: 1.0000 | Freq: Every morning | CUTANEOUS | 2 refills | Status: DC

## 2023-03-07 MED ORDER — TRETINOIN 0.025 % EX CREA: TOPICAL_CREAM | Freq: Every day | CUTANEOUS | 2 refills | Status: DC

## 2023-03-07 MED ORDER — DOXYCYCLINE HYCLATE 100 MG PO CAPS: 100.0000 mg | ORAL_CAPSULE | Freq: Two times a day (BID) | ORAL | 0 refills | Status: AC

## 2023-03-07 NOTE — Patient Instructions (Addendum)
Daily Skincare Regimen: Patient Handout  Morning Routine:  Cleanse: Start your day by washing your face with a gentle cleanser. Choose a product suitable for your skin type, such as CeraVe, Neutrogena, Cetaphil, or LaRoche-Posay. Gently massage the cleanser onto damp skin, then rinse thoroughly with lukewarm water and pat dry with a clean towel.  Apply Medication: Apply prescription medication Clindamycin swabs every morning   Moisturize: Finish your morning routine by applying a moisturizer to your face and neck. Opt for a moisturizer that has SPF included, is suitable for your skin type and provides hydration without clogging pores. Consider brands like CeraVe, Neutrogena, Cetaphil, or LaRoche-Posay for effective hydration and skin barrier support.  Evening Routine:  Cleanse: Before bed, cleanse your face again with a gentle cleanser to remove makeup, dirt, and impurities accumulated throughout the day. Use the same cleanser as in the morning and follow the same steps for cleansing.  Apply Medication: Ensure that your skin is completely dry before applying any topical treatments to maximize their efficacy.  Apply prescription medication Tretinoin 0.025% at night on Mon, Wed, and Fri.   Moisturize: After applying any medications, moisturize your skin to seal in hydration and support skin barrier function. Choose a moisturizer suitable for nighttime use that helps replenish moisture overnight. Look for products from trusted brands like CeraVe, Neutrogena, Cetaphil, or LaRoche-Posay for optimal results.   Additional Tips:  Sun Protection: During the daytime, it is essential to apply a broad-spectrum sunscreen with an SPF of 30 or higher to protect your skin from harmful UV rays. Apply sunscreen as the last step of your morning skincare routine and reapply throughout the day as needed, especially if you will be spending time  outdoors.  Hydration: Drink plenty of water throughout the day to keep your skin hydrated from within.  Healthy Lifestyle: Maintain a balanced diet, get regular exercise, manage stress, and prioritize adequate sleep to support overall skin health.   Following a consistent daily skincare regimen can help promote healthy, radiant skin and minimize the risk of common skin issues. Be patient and consistent with your routine, and remember to listen to your skin's needs     Due to recent changes in healthcare laws, you may see results of your pathology and/or laboratory studies on MyChart before the doctors have had a chance to review them. We understand that in some cases there may be results that are confusing or concerning to you. Please understand that not all results are received at the same time and often the doctors may need to interpret multiple results in order to provide you with the best plan of care or course of treatment. Therefore, we ask that you please give [place] 2 business days to thoroughly review all your results before contacting the office for clarification. Should we see a critical lab result, you will be contacted sooner.   If You Need Anything After Your Visit  If you have any questions or concerns for your doctor, please call our main line at [phone] If no one answers, please leave a voicemail as directed and we will return your call as soon as possible. Messages left after 4 pm will be answered the following business day.   You may also send [place] a message via MyChart. We typically respond to MyChart messages within 1-2 business days.  For prescription refills, please ask your pharmacy to contact our office. Our fax number is [phone].  If you have an urgent issue when the clinic is closed  that cannot wait until the next business day, you can page your doctor at the number below.    Please note that while we do our best to be available for urgent issues outside of  office hours, we are not available 24/7.   If you have an urgent issue and are unable to reach [place], you may choose to seek medical care at your doctor's office, retail clinic, urgent care center, or emergency room.  If you have a medical emergency, please immediately call 911 or go to the emergency department. In the event of inclement weather, please call our main line at [phone] for an update on the status of any delays or closures.  Dermatology Medication Tips: Please keep the boxes that topical medications come in in order to help keep track of the instructions about where and how to use these. Pharmacies typically print the medication instructions only on the boxes and not directly on the medication tubes.   If your medication is too expensive, please contact our office at [phone] or send [place] a message through MyChart.   We are unable to tell what your co-pay for medications will be in advance as this is different depending on your insurance coverage. However, we may be able to find a substitute medication at lower cost or fill out paperwork to get insurance to cover a needed medication.   If a prior authorization is required to get your medication covered by your insurance company, please allow [place] 1-2 business days to complete this process.  Drug prices often vary depending on where the prescription is filled and some pharmacies may offer cheaper prices.  The website www.goodrx.com contains coupons for medications through different pharmacies. The prices here do not account for what the cost may be with help from insurance (it may be cheaper with your insurance), but the website can give you the price if you did not use any insurance.  - You can print the associated coupon and take it with your prescription to the pharmacy.  - You may also stop by our office during regular business hours and pick up a GoodRx coupon card.  - If you need your prescription sent electronically to a  different pharmacy, notify our office through [facility] or by phone at [phone]

## 2023-03-07 NOTE — Progress Notes (Signed)
   New Patient Visit   Subjective  Joan Rose is a 27 y.o. female who presents for the following: A lump under the skin x 8 months. No prior treatment. No pain, no drainage, no noted change in size.   The following portions of the chart were reviewed this encounter and updated as appropriate: medications, allergies, medical history  Review of Systems:  No other skin or systemic complaints except as noted in HPI or Assessment and Plan.  Objective  Well appearing patient in no apparent distress; mood and affect are within normal limits.   A focused examination was performed of the following areas: Face  Relevant exam findings are noted in the Assessment and Plan.    Assessment & Plan   EPIDERMAL INCLUSION CYST Exam: 1cm non tender Subcutaneous nodule at the right cheek  Benign-appearing. Exam most consistent with an epidermal inclusion cyst. Discussed that a cyst is a benign growth that can grow over time and sometimes get irritated or inflamed. Recommend observation if it is not bothersome. Discussed option of surgical excision to remove it if it is growing, symptomatic, or other changes noted. Please call for new or changing lesions so they can be evaluated.   Reassured benign growth.  Recommend observation.  Discussed surgical excision in office if changes noted or symptomatic.  Treatment: Doxycyline 100mg  bid x 10 days  ACNE VULGARIS Exam: Open comedones and inflammatory papules on the face  Flared  Counseling I counseled the patient regarding the following: Skin care: I discussed with the patient the impo/rtance of using cleansers, moisturizers and cosmetics that are non-comedogenic. Expectations: The patient is aware that it may take up to 2-3 months to see a 60-80% improvement of acne. Contact office if: Acne worsens or fails to improve despite months of treatment; patient develops new scars, significantly more nodules or cysts. I recommended the  following:   Medication Counseling Retinoid Counseling: Patient advised to apply a pea-sized amount only at bedtime and wait 30 minutes after washing their face before applying. If too drying, patient may add a non-comedogenic moisturizer. The patient verbalized understanding of the proper use and possible adverse effects of retinoids. All of the patient's questions and concerns were addressed.   Treatment Plan: Clindamycin swabs to face every morning Tretinoin 0.025% cream every other night until well tolerated.        Return in about 3 months (around 06/07/2023).  Jaclynn Guarneri, CMA, am acting as scribe for [employer], DO.   Documentation: I have reviewed the above documentation for accuracy and completeness, and I agree with the above.  Langston Reusing, DO

## 2023-05-31 ENCOUNTER — Other Ambulatory Visit: Payer: Self-pay

## 2023-05-31 MED ORDER — CLINDAMYCIN PHOSPHATE 1 % EX SWAB: 1.0000 | Freq: Every morning | CUTANEOUS | 4 refills | Status: AC

## 2023-06-07 ENCOUNTER — Ambulatory Visit: Payer: Medicaid Other | Admitting: Dermatology

## 2023-07-07 ENCOUNTER — Telehealth: Payer: Self-pay | Admitting: Adult Health

## 2023-07-07 NOTE — Telephone Encounter (Signed)
Patient is requesting a call from [place]. Please advise.

## 2023-07-07 NOTE — Telephone Encounter (Signed)
Pt thinks she may have yeast, will call for appt

## 2023-08-10 ENCOUNTER — Ambulatory Visit: Payer: Medicaid Other | Admitting: Dermatology

## 2023-08-16 ENCOUNTER — Encounter: Payer: Self-pay | Admitting: Dermatology

## 2023-08-16 ENCOUNTER — Ambulatory Visit: Payer: Medicaid Other | Admitting: Dermatology

## 2023-08-16 VITALS — BP 107/73 | HR 83

## 2023-08-16 DIAGNOSIS — L219 Seborrheic dermatitis, unspecified: Secondary | ICD-10-CM

## 2023-08-16 DIAGNOSIS — L700 Acne vulgaris: Secondary | ICD-10-CM | POA: Diagnosis not present

## 2023-08-16 DIAGNOSIS — L659 Nonscarring hair loss, unspecified: Secondary | ICD-10-CM | POA: Diagnosis not present

## 2023-08-16 DIAGNOSIS — L810 Postinflammatory hyperpigmentation: Secondary | ICD-10-CM

## 2023-08-16 MED ORDER — CLOBETASOL PROPIONATE 0.05 % EX SOLN: 1.0000 | Freq: Two times a day (BID) | CUTANEOUS | 6 refills | Status: AC

## 2023-08-16 MED ORDER — SAFETY SEAL MISCELLANEOUS MISC
1.0000 | Freq: Every evening | 4 refills | Status: DC
Start: 2023-08-16 — End: 2024-05-08

## 2023-08-16 NOTE — Progress Notes (Signed)
Follow-Up Visit   Subjective  Joan Rose is a 27 y.o. female who presents for the following: Acne, Hair Thinning and Cyst  Patient present today for follow up visit for Acne and Cyst. Patient was last evaluated on 03/07/23. At her previous visit she clindamycin swabs and tretinoin. She reports she is still using Tretinoin Patient reports sxs are better. Patient denies medication changes. Patient reports she also has concerns about hair thinning that she would like to discuss.   The following portions of the chart were reviewed this encounter and updated as appropriate: medications, allergies, medical history  Review of Systems:  No other skin or systemic complaints except as noted in HPI or Assessment and Plan.  Objective  Well appearing patient in no apparent distress; mood and affect are within normal limits.  A focused examination was performed of the following areas: Face  Relevant exam findings are noted in the Assessment and Plan.          Assessment & Plan   ACNE VULGARIS Exam: Open comedones and inflammatory papules  Well controlled   Treatment Plan: - Recommended to continue Tretinoin 0.025% M-W- and Friday nights - Recommended applying Hyaluronic Acid prior to applying Tretinoin to prevent excessive dryness - Recommended washing face with La Roche Posay Lipkur facial wash and Triple Lipkur Moisturizer, samples provided while in office today - We will plan to follow up in 3 months to reassess progress  Hyperpigmentation Exam: Brown macules in the areas of prior acne lesions  Patient Education I counseled the patient regarding the following: Skin care: Recommend minimizing sun exposure, wearing sunscreen and protective clothing. / Expectations: Post Inflammatory pigmentary change is lighter or darker discoloration than surrounding skin resulting from trauma or rashes. Areas tend to normalize over time, but can take months to years.  Treatment Plan: -  Continue topical retinoid to help lift some excess pigment - We will plan to prescribe Melaxemic, we will send this prescription to [facility] - Recommended broad spectrum SPF30 every day, hats and sun avoidence.   SEBORRHEIC DERMATITIS  Exam: Excessive itching of scalp  Flared  Seborrheic Dermatitis is a chronic persistent rash characterized by pinkness and scaling most commonly of the mid face but also can occur on the scalp (dandruff), ears; mid chest, mid back and groin.  It tends to be exacerbated by stress and cooler weather.  People who have neurologic disease may experience new onset or exacerbation of existing seborrheic dermatitis.  The condition is not curable but treatable and can be controlled.  Treatment Plan: - Recommended washing hair every 2 weeks with DHS Zinc Shampoo, allowing to sit on the scalp for about 1-2 minute prior to rinsing out - We will plan to prescribe Clobetasol drops to apply 1-2 times for excessive itchy sensation on scalp  Hair Thinning Exam: Excessive hair shedding and thinning   Treatment Plan: - We will plan to check labs to r/o hair thinning causes, lab slips provided while in office today  Seborrheic dermatitis  Related Medications clobetasol (TEMOVATE) 0.05 % external solution Apply 1 Application topically 2 (two) times daily.  Hair thinning  Related Procedures TSH DHEA-sulfate T4 Follicle Stimulating Hormone Luteinizing Hormone Hormone Panel Testosterone Ferritin Iron  Post-inflammatory hyperpigmentation  Related Medications Safety Seal Miscellaneous MISC Apply 1 Application topically at bedtime. Medication name: Melaxemic Cream Tranexamic Acid 5%, Kojic Acid 2%, Vit C 2.5%, Hyaluronic Acid 0.1%. Apply directly to face on the nights you are not applying Tretinoin  Return in about 3 months (around 11/14/2023) for Hair Thinning and Acne F/U.    Documentation: I have reviewed the above documentation for accuracy and  completeness, and I agree with the above.  Stasia Cavalier, am acting as scribe for Langston Reusing, DO.  Langston Reusing, DO

## 2023-08-16 NOTE — Patient Instructions (Addendum)
Hello Joan Rose,  Thank you for visiting [place] today. We are grateful for your dedication to enhancing your skin and overall health. Below is a summary of the essential instructions from today's consultation:  - Medications and Skincare:   - Tretinoin 0.025%: Continue application every other night.   - Clindamycin: Use in the morning. Your prescription will be refilled.   - Lightening Cream: Apply on alternate nights when not using Tretinoin.   - Vichy Mineral 89 Thermal Water: Use before Tretinoin application for dryness.   - Moisturizers: Toleriane in the morning and Lipikar at night. Samples have been provided.   - Clobetasol Drops: For scalp itching, use as needed.  - Hair Care:   - Washing Frequency: Increase to every two weeks with DHS Zinc shampoo, followed by your regular hydrating shampoo and conditioner.   - Scalp Oils: Avoid applying directly to scalp.  - Laboratory Tests:   - Tests Ordered: CBC, ferritin, TIBC, thyroid (TSH and free T4), and hormonal panel (DHEA, FSH, LH, estrogen, testosterone).  - Follow-Up:   - Next Appointment: Schedule a follow-up in three months.   - Lab Results: Await results; we will contact you for any abnormalities.   Please adhere to these instructions carefully and reach out with any questions or concerns. We eagerly anticipate your next visit in three months and wish you a joyful holiday season.  Warm regards,  Dr. Langston Reusing [facility]          Important Information   Due to recent changes in healthcare laws, you may see results of your pathology and/or laboratory studies on MyChart before the doctors have had a chance to review them. We understand that in some cases there may be results that are confusing or concerning to you. Please understand that not all results are received at the same time and often the doctors may need to interpret multiple results in order to provide you with the best plan of care or course of treatment.  Therefore, we ask that you please give [place] 2 business days to thoroughly review all your results before contacting the office for clarification. Should we see a critical lab result, you will be contacted sooner.     If You Need Anything After Your Visit   If you have any questions or concerns for your doctor, please call our main line at [phone]. If no one answers, please leave a voicemail as directed and we will return your call as soon as possible. Messages left after 4 pm will be answered the following business day.    You may also send [place] a message via MyChart. We typically respond to MyChart messages within 1-2 business days.  For prescription refills, please ask your pharmacy to contact our office. Our fax number is [phone].  If you have an urgent issue when the clinic is closed that cannot wait until the next business day, you can page your doctor at the number below.     Please note that while we do our best to be available for urgent issues outside of office hours, we are not available 24/7.    If you have an urgent issue and are unable to reach [place], you may choose to seek medical care at your doctor's office, retail clinic, urgent care center, or emergency room.   If you have a medical emergency, please immediately call 911 or go to the emergency department. In the event of inclement weather, please call our main line at [phone] for an  update on the status of any delays or closures.  Dermatology Medication Tips: Please keep the boxes that topical medications come in in order to help keep track of the instructions about where and how to use these. Pharmacies typically print the medication instructions only on the boxes and not directly on the medication tubes.   If your medication is too expensive, please contact our office at [phone] or send [place] a message through MyChart.    We are unable to tell what your co-pay for medications will be in advance as this is  different depending on your insurance coverage. However, we may be able to find a substitute medication at lower cost or fill out paperwork to get insurance to cover a needed medication.    If a prior authorization is required to get your medication covered by your insurance company, please allow [place] 1-2 business days to complete this process.   Drug prices often vary depending on where the prescription is filled and some pharmacies may offer cheaper prices.   The website www.goodrx.com contains coupons for medications through different pharmacies. The prices here do not account for what the cost may be with help from insurance (it may be cheaper with your insurance), but the website can give you the price if you did not use any insurance.  - You can print the associated coupon and take it with your prescription to the pharmacy.  - You may also stop by our office during regular business hours and pick up a GoodRx coupon card.  - If you need your prescription sent electronically to a different pharmacy, notify our office through [facility] or by phone at [phone]

## 2023-09-05 LAB — HORMONE PANEL (T4,TSH,FSH,TESTT,SHBG,DHEA,ETC)
DHEA-Sulfate, LCMS: 121 ug/dL
Estradiol, Serum, MS: 52 pg/mL
Estrone Sulfate: 154 ng/dL
Follicle Stimulating Hormone: 4.8 m[IU]/mL
Free T-3: 2.9 pg/mL
Free Testosterone, Serum: 3.4 pg/mL
Progesterone, Serum: 269 ng/dL
Sex Hormone Binding Globulin: 48.5 nmol/L
T4: 7.9 ug/dL
TSH: 0.8 uU/mL
Testosterone, Serum (Total): 24 ng/dL
Testosterone-% Free: 1.4 %
Triiodothyronine (T-3), Serum: 116 ng/dL

## 2023-09-05 LAB — T4: T4, Total: 8.6 ug/dL (ref 4.5–12.0)

## 2023-09-05 LAB — TSH: TSH: 0.848 u[IU]/mL (ref 0.450–4.500)

## 2023-09-05 LAB — FERRITIN: Ferritin: 105 ng/mL (ref 15–150)

## 2023-09-05 LAB — DHEA-SULFATE: DHEA-SO4: 173 ug/dL (ref 84.8–378.0)

## 2023-09-05 LAB — IRON: Iron: 87 ug/dL (ref 27–159)

## 2023-09-05 LAB — LUTEINIZING HORMONE: LH: 5.7 m[IU]/mL

## 2023-09-05 LAB — TESTOSTERONE: Testosterone: 18 ng/dL (ref 13–71)

## 2023-09-05 LAB — FOLLICLE STIMULATING HORMONE: FSH: 4 m[IU]/mL

## 2023-09-18 ENCOUNTER — Encounter: Payer: Self-pay | Admitting: Dermatology

## 2023-11-15 ENCOUNTER — Ambulatory Visit: Payer: Medicaid Other | Admitting: Dermatology

## 2023-12-08 ENCOUNTER — Ambulatory Visit: Payer: Medicaid Other | Admitting: Adult Health

## 2023-12-08 ENCOUNTER — Encounter: Payer: Self-pay | Admitting: Adult Health

## 2023-12-08 VITALS — BP 141/88 | HR 78 | Ht 66.0 in | Wt 176.0 lb

## 2023-12-08 DIAGNOSIS — Z1331 Encounter for screening for depression: Secondary | ICD-10-CM

## 2023-12-08 DIAGNOSIS — R030 Elevated blood-pressure reading, without diagnosis of hypertension: Secondary | ICD-10-CM

## 2023-12-08 DIAGNOSIS — Z319 Encounter for procreative management, unspecified: Secondary | ICD-10-CM | POA: Diagnosis not present

## 2023-12-08 DIAGNOSIS — Z0000 Encounter for general adult medical examination without abnormal findings: Secondary | ICD-10-CM

## 2023-12-08 NOTE — Patient Instructions (Signed)
CLOMID INSTRUCTIONS  WHY USE IT? Clomid helps your ovaries to release eggs (ovulate).  HOW TO USE IT? Clomid is taken as a pill usually on days 5,6,7,8, & 9 of your cycle.  Day 1 is the first day of your period. The dose or duration may be changed to achieve ovulation.  Provera (progesterone) may first be used to bring on a period for some patients. The day of ovulation on Clomid is usually between cycle day 14 and 17.  Having sexual intercourse at least every other day between cycle day 13 and 18 will improve your chances of becoming pregnant during the Clomid cycle.  You may monitor your ovulation using basal body temperature charts or with ovulation kits.  If using the ovulation predictor kits, having intercourse the day of the surge and the two days following is recommended. If you get your period, call when it starts for an appointment with your doctor, so that an exam may be done, and another Clomid cycle can be considered if appropriate. If you do not get a period by day 35 of the cycle, please get a blood pregnancy test.  If it is negative, speak to your doctor for instructions to bring on another period and to plan a follow-up appointment.  THINGS TO KNOW: If you get pregnant while using Clomid, your chance of twins is 7%m and triplets is less than 1%. Some studies have suggested the use of "fertility drugs" may increase your risk of ovarian cancers in the future.  It is unclear if these drugs increase the risk, or people who have problems with fertility are prone for these cancers.  If there is an actual risk, it is very low.  If you have a history of liver problems or ovarian cancer, it may be wise to avoid this medication.  SIDE EFFECTS: The most common side effect is hot flashes (20%). Breast tenderness, headaches, nausea, bloating may also occur at different times. Less than 3/1,000 people have dryness or loss of hair. Persistent ovarian cysts may form from the use of this  medication. Ovarian hyperstimulation syndrome is a rare side effect at low doses. Visual changes like flashes of light or blurring.   

## 2023-12-08 NOTE — Progress Notes (Signed)
 Patient ID: Joan Rose, female   DOB: 1995/10/26, 28 y.o.   MRN: [id number] History of Present Illness: Joan Rose is a 28 year old black female, with SO, G1P0101, in for a well woman gyn exam. She still wants to get pregnant. Partner had low sperm count and sees urology soon in follow up.     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 BENIGN REACTIVE/REPARATIVE CHANGES.   HPVHIGH Negative 10/29/2022 0840   HPVHIGH Positive (A) 04/30/2021 0842   ADEQPAP  10/29/2022 0840    Satisfactory for evaluation; transformation zone component PRESENT.   ADEQPAP  04/30/2021 0842    Satisfactory for evaluation; transformation zone component PRESENT.   ADEQPAP  11/15/2017 0000    Satisfactory for evaluation  endocervical/transformation zone component PRESENT.    PCP is I Polanco NP   Current Medications, Allergies, Past Medical History, Past Surgical History, Family History and Social History were reviewed in [employer] record.     Review of Systems: Patient denies any headaches, hearing loss, fatigue, blurred vision, shortness of breath, chest pain, abdominal pain, problems with bowel movements, urination, or intercourse. No joint pain or mood swings.     Physical Exam:BP (!) 141/88 (BP Location: Right Arm, Patient Position: Sitting, Cuff Size: Normal)   Pulse 78   Ht 5\' 6"  (1.676 m)   Wt 176 lb (79.8 kg)   LMP 11/26/2023 (Exact Date)   BMI 28.41 kg/m   General:  Well developed, well nourished, no acute distress Skin:  Warm and dry Neck:  Midline trachea, normal thyroid, good ROM, no lymphadenopathy Lungs; Clear to auscultation bilaterally Breast:  No dominant palpable mass, retraction, or nipple discharge Cardiovascular: Regular rate  and rhythm Abdomen:  Soft, non tender, no hepatosplenomegaly Pelvic:  External genitalia is normal in appearance, no lesions.  The vagina is normal in appearance. Urethra has no lesions or masses. The cervix is bulbous.  Uterus is felt to be normal size, shape, and contour.  No adnexal masses or tenderness noted.Bladder is non tender, no masses felt. Extremities/musculoskeletal:  No swelling or varicosities noted, no clubbing or cyanosis Psych:  No mood changes, alert and cooperative,seems happy AA is 0 Fall risk is low    12/08/2023    8:38 AM 11/19/2022    3:52 PM 10/29/2022    8:34 AM  Depression screen PHQ 2/9  Decreased Interest 0 0 0  Down, Depressed, Hopeless 0 0 0  PHQ - 2 Score 0 0 0  Altered sleeping 0 0 0  Tired, decreased energy 1 1 1   Change in appetite 0 0 0  Feeling bad or failure about yourself  0 0 0  Trouble concentrating 0 0 0  Moving slowly or fidgety/restless 0 0 0  Suicidal thoughts 0 0 0  PHQ-9 Score 1 1 1        12/08/2023    8:38 AM 11/19/2022    3:52 PM 10/29/2022    8:34 AM 04/30/2021    8:43 AM  GAD 7 : Generalized Anxiety Score  Nervous, Anxious, on Edge 0 0 0 0  Control/stop worrying 0 0 0 0  Worry too much - different things 1 0 0 0  Trouble relaxing 0 0 0 0  Restless 0 0 0  0  Easily annoyed or irritable 0 0 0 0  Afraid - awful might happen 0 0 0 0  Total GAD 7 Score 1 0 0 0    Upstream - 12/08/23 0836       Pregnancy Intention Screening   Does the patient want to become pregnant in the next year? Yes    Does the patient's partner want to become pregnant in the next year? Yes    Would the patient like to discuss contraceptive options today? No      Contraception Wrap Up   Current Method Pregnant/Seeking Pregnancy    End Method Pregnant/Seeking Pregnancy    Contraception Counseling Provided No            Examination chaperoned by Malachy Mood LPN  Impression and plan: 1. Routine general medical examination at a health care facility  (Primary) Pap in 2027 Physical in 1 year  2. Patient desires pregnancy Check progesterone level 12/16/23 has order  - Progesterone Review Clomid info  3. Elevated BP without diagnosis of hypertension Keep check on BP

## 2023-12-17 LAB — PROGESTERONE: Progesterone: 3.3 ng/mL

## 2024-01-04 ENCOUNTER — Other Ambulatory Visit: Payer: Self-pay | Admitting: Adult Health

## 2024-01-04 DIAGNOSIS — Z319 Encounter for procreative management, unspecified: Secondary | ICD-10-CM

## 2024-01-04 NOTE — Progress Notes (Signed)
 Ck progesterone  level may 6

## 2024-01-18 LAB — PROGESTERONE: Progesterone: 6.9 ng/mL

## 2024-01-19 ENCOUNTER — Other Ambulatory Visit: Payer: Self-pay | Admitting: Adult Health

## 2024-01-19 MED ORDER — CLOMIPHENE CITRATE 50 MG PO TABS: ORAL_TABLET | ORAL | 2 refills | Status: AC

## 2024-01-19 NOTE — Progress Notes (Signed)
Will rx clomid 

## 2024-01-30 ENCOUNTER — Other Ambulatory Visit: Payer: Self-pay | Admitting: Adult Health

## 2024-01-30 DIAGNOSIS — Z319 Encounter for procreative management, unspecified: Secondary | ICD-10-CM

## 2024-01-30 NOTE — Progress Notes (Signed)
 Ck progesterone  level June 4 order is in

## 2024-02-16 ENCOUNTER — Ambulatory Visit: Payer: Self-pay | Admitting: Adult Health

## 2024-02-16 LAB — PROGESTERONE: Progesterone: 12 ng/mL

## 2024-02-23 ENCOUNTER — Other Ambulatory Visit: Payer: Self-pay | Admitting: Adult Health

## 2024-02-23 DIAGNOSIS — Z319 Encounter for procreative management, unspecified: Secondary | ICD-10-CM

## 2024-02-23 NOTE — Progress Notes (Signed)
 Ck progesterone  level 7/2, order is in

## 2024-03-15 LAB — PROGESTERONE: Progesterone: 5.9 ng/mL

## 2024-03-20 ENCOUNTER — Ambulatory Visit: Payer: Self-pay | Admitting: Adult Health

## 2024-03-23 ENCOUNTER — Other Ambulatory Visit: Payer: Self-pay | Admitting: Adult Health

## 2024-03-23 DIAGNOSIS — Z319 Encounter for procreative management, unspecified: Secondary | ICD-10-CM

## 2024-03-23 NOTE — Progress Notes (Signed)
 Ck progesterone  level 04/10/24

## 2024-03-29 ENCOUNTER — Encounter: Payer: Self-pay | Admitting: Family Medicine

## 2024-03-29 ENCOUNTER — Other Ambulatory Visit: Payer: Self-pay | Admitting: Dermatology

## 2024-03-29 ENCOUNTER — Ambulatory Visit: Admitting: Family Medicine

## 2024-03-29 VITALS — BP 122/80 | HR 85 | Ht 66.0 in | Wt 179.0 lb

## 2024-03-29 DIAGNOSIS — R7301 Impaired fasting glucose: Secondary | ICD-10-CM

## 2024-03-29 DIAGNOSIS — E538 Deficiency of other specified B group vitamins: Secondary | ICD-10-CM

## 2024-03-29 DIAGNOSIS — H04123 Dry eye syndrome of bilateral lacrimal glands: Secondary | ICD-10-CM

## 2024-03-29 DIAGNOSIS — E782 Mixed hyperlipidemia: Secondary | ICD-10-CM | POA: Diagnosis not present

## 2024-03-29 DIAGNOSIS — E559 Vitamin D deficiency, unspecified: Secondary | ICD-10-CM

## 2024-03-29 DIAGNOSIS — R5383 Other fatigue: Secondary | ICD-10-CM

## 2024-03-29 DIAGNOSIS — D509 Iron deficiency anemia, unspecified: Secondary | ICD-10-CM

## 2024-03-29 DIAGNOSIS — H5310 Unspecified subjective visual disturbances: Secondary | ICD-10-CM

## 2024-03-29 MED ORDER — RESTASIS 0.05 % OP EMUL: 1.0000 [drp] | Freq: Two times a day (BID) | OPHTHALMIC | 2 refills | Status: AC

## 2024-03-29 NOTE — Assessment & Plan Note (Signed)
 Fatigue - Etiology Unknown Recent thyroid panel normal  Vitamin D and Vitamin B12 levels ordered to assess for nutritional deficiencies contributing to fatigue.

## 2024-03-29 NOTE — Progress Notes (Signed)
 Established Patient Office Visit   Subjective  Patient ID: Joan Rose, female    DOB: October 21, 1995  Age: 29 y.o. 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  The patient reports experiencing persistent dryness, burning,sensation in both eyes for several weeks She notes increased eye irritation. In addition to ocular symptoms, the patient reports ongoing fatigue that has progressively worsened over the past several weeks. She describes it as a constant lack of energy, even after a full night's sleep. The fatigue interferes with her daily activities and concentration. The patient also reports gradual thinning of hair,  She denies recent changes in diet or hair care routine. There is no history of recent illness, significant weight loss, or high stress levels, symptoms. She denies fever, night sweats, significant weight changes, or other systemic symptoms. No recent travel or known exposures.      Review of Systems  Constitutional:  Negative for chills and fever.  Respiratory:  Negative for shortness of breath.   Cardiovascular:  Negative for chest pain.  Gastrointestinal:  Negative for abdominal pain.  Genitourinary:  Negative for dysuria.      Objective:     BP 122/80   Pulse 85   Ht 5' 6 (1.676 m)   Wt 179 lb (81.2 kg)   SpO2 97%   BMI 28.89 kg/m  BP Readings from Last 3 Encounters:  03/29/24 122/80  12/08/23 (!) 141/88  08/16/23 107/73      Physical Exam Vitals reviewed.  Constitutional:      General: She is not in acute distress.    Appearance: Normal appearance. She is not ill-appearing, toxic-appearing or diaphoretic.  HENT:     Head: Normocephalic.  Eyes:     General:        Right eye: No  discharge.        Left eye: No discharge.     Conjunctiva/sclera: Conjunctivae normal.  Cardiovascular:     Rate and Rhythm: Normal rate.     Pulses: Normal pulses.     Heart sounds: Normal heart sounds.  Pulmonary:     Effort: Pulmonary effort is normal. No respiratory distress.     Breath sounds: Normal breath sounds.  Skin:    General: Skin is warm and dry.  Neurological:     Mental Status: She is alert.  Psychiatric:        Mood and Affect: Mood normal.        Behavior: Behavior normal.      No results found for any visits on 03/29/24.  The ASCVD Risk score (Arnett DK, et al., 2019) failed to calculate for the following reasons:   The 2019 ASCVD risk score is only valid for ages 42 to 32    Assessment & Plan:  Mixed hyperlipidemia -     Lipid panel -     CMP14+EGFR -     CBC with Differential/Platelet  IFG (impaired fasting glucose) -     Hemoglobin A1c  Vitamin D deficiency -     VITAMIN D 25 Hydroxy (Vit-D Deficiency, Fractures)  Vitamin B12 deficiency -     Vitamin B12  Iron deficiency anemia, unspecified iron deficiency anemia type  Eye strain, bilateral -     Ambulatory referral to Ophthalmology  Dry eye syndrome of both eyes Assessment & Plan: Trial on Restasis  twice daily Discussed avoid dry environments, reduce screen time, and use a humidifier to maintain moisture in the air.   Other fatigue Assessment & Plan: Fatigue - Etiology Unknown Recent thyroid panel normal  Vitamin D and Vitamin B12 levels ordered to assess for nutritional deficiencies contributing to fatigue.    Other orders -     Restasis ; Place 1 drop into both eyes every 12 (twelve) hours.  Dispense: 180 each; Refill: 2    Return in about 6 months (around 09/29/2024), or if symptoms worsen or fail to improve, for Follow up.   Joan Kidd [name], FNP

## 2024-03-29 NOTE — Patient Instructions (Signed)

## 2024-03-29 NOTE — Assessment & Plan Note (Signed)
 Trial on Restasis  twice daily Discussed avoid dry environments, reduce screen time, and use a humidifier to maintain moisture in the air.

## 2024-03-30 ENCOUNTER — Telehealth: Payer: Self-pay

## 2024-03-30 ENCOUNTER — Ambulatory Visit: Payer: Self-pay | Admitting: Family Medicine

## 2024-03-30 LAB — CMP14+EGFR
ALT: 11 IU/L (ref 0–32)
AST: 16 IU/L (ref 0–40)
Albumin: 4 g/dL (ref 4.0–5.0)
Alkaline Phosphatase: 54 IU/L (ref 44–121)
BUN/Creatinine Ratio: 19 (ref 9–23)
BUN: 15 mg/dL (ref 6–20)
Bilirubin Total: 0.5 mg/dL (ref 0.0–1.2)
CO2: 23 mmol/L (ref 20–29)
Calcium: 9 mg/dL (ref 8.7–10.2)
Chloride: 104 mmol/L (ref 96–106)
Creatinine, Ser: 0.81 mg/dL (ref 0.57–1.00)
Globulin, Total: 3 g/dL (ref 1.5–4.5)
Glucose: 84 mg/dL (ref 70–99)
Potassium: 3.9 mmol/L (ref 3.5–5.2)
Sodium: 140 mmol/L (ref 134–144)
Total Protein: 7 g/dL (ref 6.0–8.5)
eGFR: 101 mL/min/1.73 (ref >59)

## 2024-03-30 LAB — CBC WITH DIFFERENTIAL/PLATELET
Basophils Absolute: 0 x10E3/uL (ref 0.0–0.2)
Basos: 0 %
EOS (ABSOLUTE): 0.2 x10E3/uL (ref 0.0–0.4)
Eos: 3 %
Hematocrit: 34.4 % (ref 34.0–46.6)
Hemoglobin: 11 g/dL — ABNORMAL LOW (ref 11.1–15.9)
Immature Grans (Abs): 0 x10E3/uL (ref 0.0–0.1)
Immature Granulocytes: 0 %
Lymphocytes Absolute: 2.1 x10E3/uL (ref 0.7–3.1)
Lymphs: 29 %
MCH: 26.1 pg — ABNORMAL LOW (ref 26.6–33.0)
MCHC: 32 g/dL (ref 31.5–35.7)
MCV: 82 fL (ref 79–97)
Monocytes Absolute: 0.6 x10E3/uL (ref 0.1–0.9)
Monocytes: 8 %
Neutrophils Absolute: 4.3 x10E3/uL (ref 1.4–7.0)
Neutrophils: 60 %
Platelets: 290 x10E3/uL (ref 150–450)
RBC: 4.21 x10E6/uL (ref 3.77–5.28)
RDW: 13 % (ref 11.7–15.4)
WBC: 7.2 x10E3/uL (ref 3.4–10.8)

## 2024-03-30 LAB — LIPID PANEL
Chol/HDL Ratio: 2.7 ratio (ref 0.0–4.4)
Cholesterol, Total: 134 mg/dL (ref 100–199)
HDL: 50 mg/dL (ref >39)
LDL Chol Calc (NIH): 67 mg/dL (ref 0–99)
Triglycerides: 86 mg/dL (ref 0–149)
VLDL Cholesterol Cal: 17 mg/dL (ref 5–40)

## 2024-03-30 LAB — HEMOGLOBIN A1C
Est. average glucose Bld gHb Est-mCnc: 105 mg/dL
Hgb A1c MFr Bld: 5.3 % (ref 4.8–5.6)

## 2024-03-30 LAB — VITAMIN B12: Vitamin B-12: 927 pg/mL (ref 232–1245)

## 2024-03-30 LAB — VITAMIN D 25 HYDROXY (VIT D DEFICIENCY, FRACTURES): Vit D, 25-Hydroxy: 17.1 ng/mL — ABNORMAL LOW (ref 30.0–100.0)

## 2024-03-30 NOTE — Telephone Encounter (Signed)
 Copied from CRM [id number]. Topic: Referral - Status >> Mar 30, 2024  1:57 PM Charlet HERO wrote: Reason for CRM: Mliss with [facility] she stating that the procedure she needs for patient is not provided she is stating that the opthmologist that they had left last year so they only do eye surgery. The patient will need to be referred to another practice.

## 2024-04-02 NOTE — Telephone Encounter (Signed)
 Message sent to [place] [facility] to redirect referral to different office.

## 2024-04-11 ENCOUNTER — Other Ambulatory Visit: Payer: Self-pay | Admitting: Adult Health

## 2024-04-11 ENCOUNTER — Ambulatory Visit: Payer: Self-pay | Admitting: Adult Health

## 2024-04-11 DIAGNOSIS — Z319 Encounter for procreative management, unspecified: Secondary | ICD-10-CM

## 2024-04-11 LAB — PROGESTERONE: Progesterone: 9 ng/mL

## 2024-04-11 NOTE — Progress Notes (Signed)
Ck progesterone level 

## 2024-04-18 ENCOUNTER — Other Ambulatory Visit: Payer: Self-pay | Admitting: Adult Health

## 2024-04-18 DIAGNOSIS — Z319 Encounter for procreative management, unspecified: Secondary | ICD-10-CM

## 2024-04-18 NOTE — Progress Notes (Unsigned)
 Ck progesterone  05/07/24

## 2024-05-08 ENCOUNTER — Ambulatory Visit: Payer: Self-pay | Admitting: Adult Health

## 2024-05-08 ENCOUNTER — Ambulatory Visit: Admitting: Dermatology

## 2024-05-08 VITALS — BP 105/76

## 2024-05-08 DIAGNOSIS — L810 Postinflammatory hyperpigmentation: Secondary | ICD-10-CM | POA: Diagnosis not present

## 2024-05-08 DIAGNOSIS — L219 Seborrheic dermatitis, unspecified: Secondary | ICD-10-CM | POA: Diagnosis not present

## 2024-05-08 DIAGNOSIS — L700 Acne vulgaris: Secondary | ICD-10-CM | POA: Diagnosis not present

## 2024-05-08 LAB — PROGESTERONE: Progesterone: 7.6 ng/mL

## 2024-05-08 MED ORDER — FLUOCINOLONE ACETONIDE SCALP 0.01 % EX OIL: 1.0000 | TOPICAL_OIL | CUTANEOUS | 3 refills | Status: AC

## 2024-05-08 MED ORDER — SAFETY SEAL MISCELLANEOUS MISC
1.0000 | Freq: Every evening | 4 refills | Status: AC
Start: 2024-05-08 — End: ?

## 2024-05-08 NOTE — Progress Notes (Signed)
 Follow-Up Visit   Subjective  Joan Rose is a 28 y.o. female who presents for the following: Acne Vulgaris follow up - She states her acne has gotten a whole lot better. She still has some dark spots but they are improving also. She is using clindamycin  swabs in the morning and tretinoin  0.025% cream 3 nights per week.  She is also following up on hair thinning. She is uses clobetasol  solution which helps with the itching. She is washing with DHS Zinc every 3 weeks. She had her labs drawn in January and all were WNL.  The following portions of the chart were reviewed this encounter and updated as appropriate: medications, allergies, medical history  Review of Systems:  No other skin or systemic complaints except as noted in HPI or Assessment and Plan.  Objective  Well appearing patient in no apparent distress; mood and affect are within normal limits.  Areas Examined: Face, chest and back  Relevant exam findings are noted in the Assessment and Plan.            Assessment & Plan   1. Mild Acne Vulgaris with Post-Inflammatory Hyperpigmentation   - Assessment: Patient's skin condition has improved with current treatment regimen of tretinoin  and clindamycin . Experiencing some peeling with tretinoin  use, which is considered normal. Patient has been on this regimen since last year.   - Plan:      Increase tretinoin  use to 5 nights per week      Reduce tretinoin  use to 3 nights per week during cold weather and 2 nights per week in very cold weather      Add Melasmic cream containing transaminic acid, kojic acid, vitamin C, and resveratrol for evening out skin tone (approx. $45 for a 58-month supply if used sparingly; [facility] to verify and mail)      Use Melasmic cream with tretinoin  at night and again in the morning followed by sunscreen      Continue Isin-Tree sunscreen daily      Refill tretinoin  and clindamycin       Apply Avene Cicalfate balm at night, especially  during breaks from tretinoin       Plan to increase tretinoin  strength to 0.05% in March    2. Seborrheic Dermatitis with Hair Breakage   - Assessment: Patient reports cutting hair short due to breakage. No patchy hair loss observed--findings consistent with seborrheic dermatitis of the scalp.   - Plan:      Continue using DHS shampoo      Prescribe Dermasmooth FS oil for use before shampooing      Prescribe clobetasol  drops for pruritus      Advise not to extend intervals between shampooing beyond 3 weeks      Recommend collagen supplements (e.g., Vital Proteins) to support hair health      Follow-up in spring (March) to assess response to treatment modifications, evaluate tretinoin  strength increase, and review progress on fertility recommendations. POST-INFLAMMATORY HYPERPIGMENTATION   Related Medications Safety Seal Miscellaneous MISC Apply 1 Application topically at bedtime. Medication name: Melaxemic Cream  - No tretinoin  or hydrocortisone ACNE VULGARIS Exam:   Improved  Treatment Plan: Increase tretinoin  0.025% cream to 5 nights per week.  POST-INFLAMMATORY HYPERPIGMENTATION (PIH) Exam: hyperpigmented macules and/or patches at face   Post-inflammatory hyperpimentation (PIH)  is a benign condition that comes from having previous inflammation in the skin and will fade with time over months to sometimes years. Recommend daily sun protection including sunscreen SPF 30+ to sun-exposed  areas. - Recommend treating any itchy or red areas on the skin quickly to prevent new areas of PIH. Once rash has cleared, treating with prescription medicines such as hydroquinone may help fade dark spots faster.    Treatment Plan:  Start Melaxemic Cream (tranexamic acid/kojic acid/vitamin C/hyaluronic acid) daily - sent to [facility]  SEBORRHEIC DERMATITIS Exam: Pink patches with greasy scale at scalp   Seborrheic Dermatitis is a chronic persistent rash characterized by pinkness and scaling most  commonly of the mid face but also can occur on the scalp (dandruff), ears; mid chest, mid back and groin.  It tends to be exacerbated by stress and cooler weather.  People who have neurologic disease may experience new onset or exacerbation of existing seborrheic dermatitis.  The condition is not curable but treatable and can be controlled.  Treatment Plan: Continue clobetasol  solution daily as needed for itch.  Start DermaSmoothe oil apply to scalp before shampooing scalp. Let sit a couple hours or overnight.   Return in about 7 months (around 12/06/2024) for Acne, seb derm and PIH.  I, Roseline Hutchinson, CMA, am acting as scribe for [employer], DO .   Documentation: I have reviewed the above documentation for accuracy and completeness, and I agree with the above.  Delon Lenis, DO

## 2024-05-08 NOTE — Patient Instructions (Addendum)
 Date: Tue May 08 2024  Joan Rose,  Thank you for visiting today. Here is a summary of the key instructions:  - Medications:   - Increase tretinoin  use to 5 nights a week   - Reduce tretinoin  use to 3 nights when it gets cold, and 2 nights in very cold weather   - Start using Melasmic cream at night with tretinoin  and in the morning with sunscreen   - Continue using [place] sunscreen   - Use Avene Cicalfate balm at night, especially when taking a break from tretinoin    - Continue using DHS shampoo for scalp   - Use Dermasmooth FS oil before shampooing   - Use clobetasol  drops for scalp itching   - Take 20,000 IU of vitamin D  daily   - Start taking prenatal vitamins with natural folic acid  - Treatment Areas:   - Use Melasmic cream until skin tone evens out (may take 4 to 6 months)  - Follow-up:   - Next appointment in March   - Tretinoin  will be increased to 0.05 strength in March  - Other Instructions:   - Don't stretch shampooing past 3 weeks   - Stop using tretinoin  when pregnant   - Get an ultrasound to check for blockages or polyps  Please reach out if you have any questions or concerns.  Warm regards,  Dr. Delon Lenis Dermatology              Important Information  Due to recent changes in healthcare laws, you may see results of your pathology and/or laboratory studies on MyChart before the doctors have had a chance to review them. We understand that in some cases there may be results that are confusing or concerning to you. Please understand that not all results are received at the same time and often the doctors may need to interpret multiple results in order to provide you with the best plan of care or course of treatment. Therefore, we ask that you please give us  2 business days to thoroughly review all your results before contacting the office for clarification. Should we see a critical lab result, you will be contacted sooner.   If You Need  Anything After Your Visit  If you have any questions or concerns for your doctor, please call our main line at [phone] If no one answers, please leave a voicemail as directed and we will return your call as soon as possible. Messages left after 4 pm will be answered the following business day.   You may also send us  a message via MyChart. We typically respond to MyChart messages within 1-2 business days.  For prescription refills, please ask your pharmacy to contact our office. Our fax number is [phone].  If you have an urgent issue when the clinic is closed that cannot wait until the next business day, you can page your doctor at the number below.    Please note that while we do our best to be available for urgent issues outside of office hours, we are not available 24/7.   If you have an urgent issue and are unable to reach us , you may choose to seek medical care at your doctor's office, retail clinic, urgent care center, or emergency room.  If you have a medical emergency, please immediately call 911 or go to the emergency department. In the event of inclement weather, please call our main line at [phone] for an update on the status of any delays or closures.  Dermatology Medication Tips: Please keep the boxes that topical medications come in in order to help keep track of the instructions about where and how to use these. Pharmacies typically print the medication instructions only on the boxes and not directly on the medication tubes.   If your medication is too expensive, please contact our office at [phone] or send us  a message through MyChart.   We are unable to tell what your co-pay for medications will be in advance as this is different depending on your insurance coverage. However, we may be able to find a substitute medication at lower cost or fill out paperwork to get insurance to cover a needed medication.   If a prior authorization is required to get your  medication covered by your insurance company, please allow us  1-2 business days to complete this process.  Drug prices often vary depending on where the prescription is filled and some pharmacies may offer cheaper prices.  The website www.goodrx.com contains coupons for medications through different pharmacies. The prices here do not account for what the cost may be with help from insurance (it may be cheaper with your insurance), but the website can give you the price if you did not use any insurance.  - You can print the associated coupon and take it with your prescription to the pharmacy.  - You may also stop by our office during regular business hours and pick up a GoodRx coupon card.  - If you need your prescription sent electronically to a different pharmacy, notify our office through [facility] or by phone at [phone]

## 2024-05-18 ENCOUNTER — Ambulatory Visit: Admitting: Family Medicine

## 2024-05-20 ENCOUNTER — Encounter: Payer: Self-pay | Admitting: Dermatology

## 2024-06-18 ENCOUNTER — Other Ambulatory Visit: Payer: Self-pay | Admitting: Dermatology

## 2024-08-06 ENCOUNTER — Ambulatory Visit (HOSPITAL_COMMUNITY)
Admission: EM | Admit: 2024-08-06 | Discharge: 2024-08-06 | Disposition: A | Discharge status: Home / Self Care | Attending: Family Medicine | Admitting: Family Medicine

## 2024-08-06 ENCOUNTER — Encounter (HOSPITAL_COMMUNITY): Payer: Self-pay

## 2024-08-06 DIAGNOSIS — J4521 Mild intermittent asthma with (acute) exacerbation: Secondary | ICD-10-CM

## 2024-08-06 DIAGNOSIS — R062 Wheezing: Secondary | ICD-10-CM

## 2024-08-06 MED ORDER — ALBUTEROL SULFATE (2.5 MG/3ML) 0.083% IN NEBU
2.5000 mg | INHALATION_SOLUTION | Freq: Once | RESPIRATORY_TRACT | Status: AC
  Administered 2024-08-06: 2.5 mg via RESPIRATORY_TRACT

## 2024-08-06 MED ORDER — PREDNISONE 20 MG PO TABS: 40.0000 mg | ORAL_TABLET | Freq: Every day | ORAL | 0 refills | Status: AC

## 2024-08-06 MED ORDER — ALBUTEROL SULFATE (2.5 MG/3ML) 0.083% IN NEBU
INHALATION_SOLUTION | RESPIRATORY_TRACT | Status: AC
  Filled 2024-08-06: qty 3

## 2024-08-06 MED ORDER — ALBUTEROL SULFATE HFA 108 (90 BASE) MCG/ACT IN AERS: 2.0000 | INHALATION_SPRAY | RESPIRATORY_TRACT | 0 refills | Status: AC | PRN

## 2024-08-06 MED ORDER — AEROCHAMBER PLUS FLO-VU MEDIUM MISC
1.0000 | Freq: Once | Status: DC
Start: 2024-08-06 — End: 2024-08-07

## 2024-08-06 NOTE — ED Provider Notes (Addendum)
 [facility]    CSN: [id number] Arrival date & time: 08/06/24  1733      History   Chief Complaint Chief Complaint  Patient presents with   Shortness of Breath    HPI Joan Rose is a 28 y.o. female.    Shortness of Breath  Here for shortness of breath and wheezing.  Symptoms began last evening.  She had been in a parade outside  She has not really had any nasal congestion or rhinorrhea or cough.  No fever or chills or myalgia  She denies knowing of a history of asthma, but she has had to use an inhaler when she was very young.  NKDA  Last menstrual cycle November 11  Past Medical History:  Diagnosis Date   LGSIL of cervix of undetermined significance 05/08/2021   colpo per ASCCP 5 year,CIN 3+risk is 4.3 %    Medical history non-contributory     Patient Active Problem List   Diagnosis Date Noted   Dry eye syndrome of both eyes 03/29/2024   Fatigue 03/29/2024   Elevated BP without diagnosis of hypertension 12/08/2023   Dermoid cyst of face 11/19/2022   Abdominal pain, RLQ (right lower quadrant) 11/19/2022   Patient desires pregnancy 10/29/2022   LGSIL on Pap smear of cervix 10/29/2022   Routine general medical examination at a health care facility 10/29/2022   Encounter for routine gynecological examination with Papanicolaou smear of cervix 10/29/2022   LGSIL of cervix of undetermined significance 05/08/2021   Family planning 04/11/2019   Encounter for well woman exam with routine gynecological exam 04/11/2019   Screening examination for STD (sexually transmitted disease) 04/11/2019   Encounter for initial prescription of contraceptive pills 11/15/2017   Preterm delivery 11/10/2015   H/O Preeclampsia, severe 11/10/2015   History of breast lump/mass excision 11/10/2015   Chlamydia infection affecting pregnancy in first trimester, antepartum 03/03/2015   Irregular menses 06/07/2013   Encounter for gynecological examination with Papanicolaou  smear of cervix 06/07/2013    Past Surgical History:  Procedure Laterality Date   BREAST BIOPSY Right 09/30/2014   Procedure: BREAST BIOPSY;  Surgeon: Oneil DELENA Budge, MD;  Location: [facility];  Service: General;  Laterality: Right;    OB History     Gravida  1   Para  1   Term      Preterm  1   AB      Living  1      SAB      IAB      Ectopic      Multiple  0   Live Births  1            Home Medications    Prior to Admission medications   Medication Sig Start Date End Date Taking? Authorizing Provider  albuterol  (VENTOLIN  HFA) 108 (90 Base) MCG/ACT inhaler Inhale 2 puffs into the lungs every 4 (four) hours as needed for wheezing or shortness of breath. 08/06/24  Yes Vonna Sharlet POUR, MD  predniSONE  (DELTASONE ) 20 MG tablet Take 2 tablets (40 mg total) by mouth daily with breakfast for 5 days. 08/06/24 08/11/24 Yes Vonna Sharlet POUR, MD  clindamycin  (CLEOCIN  T) 1 % SWAB Apply 1 Application topically in the morning. Apply to face every morning 05/31/23   Alm Delon SAILOR, DO  clobetasol  (TEMOVATE ) 0.05 % external solution Apply 1 Application topically 2 (two) times daily. 08/16/23   Alm Delon SAILOR, DO  clomiPHENE  (CLOMID ) 50 MG tablet Take 1 tablet  on days 5-9 of cycle Patient not taking: Reported on 08/06/2024 01/19/24   Signa Delon LABOR, NP  cycloSPORINE  (RESTASIS ) 0.05 % ophthalmic emulsion Place 1 drop into both eyes every 12 (twelve) hours. 03/29/24   Del Orbe Polanco, Iliana, FNP  Fluocinolone  Acetonide Scalp (DERMA-SMOOTHE /FS SCALP) 0.01 % OIL Apply 1 application  topically as directed. Apply to scalp before shampooing. Let sit a couple hours or overnight. 05/08/24   Alm Delon SAILOR, DO  Multiple Vitamin (MULTIVITAMIN) tablet Take 1 tablet by mouth daily.    [provider]  RETIN-A  0.025 % cream APPLY A PEA SIZED AMOUNT TO FACE 3 TIMES WEEKLY EVERY NIGHT AT BEDTIME 04/02/24   Alm Delon SAILOR, DO  Safety Seal Miscellaneous MISC Apply 1  Application topically at bedtime. Medication name: Melaxemic Cream  - No tretinoin  or hydrocortisone 05/08/24   Alm Delon SAILOR, DO    Family History Family History  Problem Relation Age of Onset   Cancer Maternal Grandmother        throat    Social History Social History   Tobacco Use   Smoking status: Never   Smokeless tobacco: Never  Vaping Use   Vaping status: Never Used  Substance Use Topics   Alcohol use: No   Drug use: No     Allergies   Patient has no known allergies.   Review of Systems Review of Systems  Respiratory:  Positive for shortness of breath.      Physical Exam Triage Vital Signs ED Triage Vitals [08/06/24 1824]  Encounter Vitals Group     BP 129/85     Girls Systolic BP Percentile      Girls Diastolic BP Percentile      Boys Systolic BP Percentile      Boys Diastolic BP Percentile      Pulse Rate 84     Resp 16     Temp 98.8 F (37.1 C)     Temp Source Oral     SpO2 95 %     Weight      Height      Head Circumference      Peak Flow      Pain Score 8     Pain Loc      Pain Education      Exclude from Growth Chart    No data found.  Updated Vital Signs BP 129/85 (BP Location: Right Arm)   Pulse 84   Temp 98.8 F (37.1 C) (Oral)   Resp 16   LMP 07/24/2024 (Exact Date)   SpO2 95%   Visual Acuity Right Eye Distance:   Left Eye Distance:   Bilateral Distance:    Right Eye Near:   Left Eye Near:    Bilateral Near:     Physical Exam Vitals reviewed.  Constitutional:      General: She is not in acute distress.    Appearance: She is not toxic-appearing.  HENT:     Nose: Nose normal.     Mouth/Throat:     Mouth: Mucous membranes are moist.     Pharynx: No oropharyngeal exudate or posterior oropharyngeal erythema.  Eyes:     Extraocular Movements: Extraocular movements intact.     Conjunctiva/sclera: Conjunctivae normal.     Pupils: Pupils are equal, round, and reactive to light.  Cardiovascular:     Rate and  Rhythm: Normal rate and regular rhythm.     Heart sounds: No murmur heard. Pulmonary:     Effort:  No respiratory distress.     Breath sounds: No stridor. No rhonchi or rales.     Comments: There are bilateral end expiratory wheezes.  Air movement is restricted and expiratory phase is diminished in audibility Chest:     Chest wall: No tenderness.  Musculoskeletal:     Cervical back: Neck supple.  Lymphadenopathy:     Cervical: No cervical adenopathy.  Skin:    Capillary Refill: Capillary refill takes less than 2 seconds.     Coloration: Skin is not jaundiced or pale.  Neurological:     General: No focal deficit present.     Mental Status: She is alert and oriented to person, place, and time.  Psychiatric:        Behavior: Behavior normal.      UC Treatments / Results  Labs (all labs ordered are listed, but only abnormal results are displayed) Labs Reviewed - No data to display  EKG   Radiology No results found.  Procedures Procedures (including critical care time)  Medications Ordered in UC Medications  AeroChamber Plus Flo-Vu Medium MISC 1 each (has no administration in time range)  albuterol  (PROVENTIL ) (2.5 MG/3ML) 0.083% nebulizer solution 2.5 mg (2.5 mg Nebulization Given 08/06/24 1853)    Initial Impression / Assessment and Plan / UC Course  I have reviewed the triage vital signs and the nursing notes.  Pertinent labs & imaging results that were available during my care of the patient were reviewed by me and considered in my medical decision making (see chart for details).     After the nebulizer treatment she feels some better.  On exam she is wheezing less and moving air better, though I do still hear some end expiratory wheezes.  Albuterol  and prednisone  are sent into the pharmacy for an asthma exacerbation.  Spacer is supplied here.  I gave her verbal and written instructions on how to use an inhaler.  She will follow-up with primary care Final  Clinical Impressions(s) / UC Diagnoses   Final diagnoses:  Wheezing  Mild intermittent asthma with exacerbation     Discharge Instructions      Albuterol  inhaler--do 2 puffs every 4 hours as needed for shortness of breath or wheezing  Take prednisone  20 mg--2 daily for 5 days  Please follow-up with your primary care     ED Prescriptions     Medication Sig Dispense Auth. Provider   albuterol  (VENTOLIN  HFA) 108 (90 Base) MCG/ACT inhaler Inhale 2 puffs into the lungs every 4 (four) hours as needed for wheezing or shortness of breath. 1 each Vonna Sharlet POUR, MD   predniSONE  (DELTASONE ) 20 MG tablet Take 2 tablets (40 mg total) by mouth daily with breakfast for 5 days. 10 tablet Vonna Jenalyn Girdner K, MD      PDMP not reviewed this encounter.   Vonna Sharlet POUR, MD 08/06/24 GREGOR    Vonna Sharlet POUR, MD 08/06/24 [id number]

## 2024-08-06 NOTE — ED Triage Notes (Signed)
 Patient here today with c/o SOB, wheeze, and nasal congestion since last night. Nasal congestion is worse when lying down. Patient states that her symptoms started after a parade. No known sick contacts.

## 2024-08-06 NOTE — Discharge Instructions (Signed)
 Albuterol  inhaler--do 2 puffs every 4 hours as needed for shortness of breath or wheezing  Take prednisone  20 mg--2 daily for 5 days  Please follow-up with your primary care

## 2024-08-14 ENCOUNTER — Ambulatory Visit: Admitting: Family Medicine

## 2024-08-14 ENCOUNTER — Ambulatory Visit

## 2024-08-14 ENCOUNTER — Ambulatory Visit: Payer: Self-pay

## 2024-08-14 VITALS — BP 117/68 | HR 85 | Temp 98.1°F | Ht 66.0 in | Wt 180.0 lb

## 2024-08-14 DIAGNOSIS — R0602 Shortness of breath: Secondary | ICD-10-CM

## 2024-08-14 DIAGNOSIS — Z862 Personal history of diseases of the blood and blood-forming organs and certain disorders involving the immune mechanism: Secondary | ICD-10-CM

## 2024-08-14 NOTE — Progress Notes (Signed)
 Acute Office Visit  Patient ID: Joan Rose, female    DOB: [birth year], 28 y.o.   MRN: [id number]  PCP: Terry Wilhelmena Lloyd Hilario, FNP  Chief Complaint  Patient presents with   Shortness of Breath    Since 11/23- Was seen at Urgent Care on 11/24 and was given Oxygen and Inhaler. Breathing got better after that. Says she has not used the inhaler in about 3 days but is still getting short winded and doesn't feel normal.     Subjective:     Shortness of Breath    Discussed the use of AI scribe software for clinical note transcription with the patient, who gave verbal consent to proceed.  History of Present Illness   Joan Rose is a 28 year old female who presents with shortness of breath and wheezing.  Respiratory symptoms - Shortness of breath and wheezing began on 08/05/2024 - Went to UC on 08/06/24.  - Treatment at urgent care included a breathing treatment, a five-day course of prednisone , and an albuterol  inhaler. - No use of albuterol  inhaler for the past three days. Symptoms have improved a lot but the patient still feels some ShOB and not back to her normal self.  - Occasional palpitations. - No associated fever, nasal congestion, or cough. - No history of asthma, but recalls using an inhaler during childhood.  Hematologic history - Patient reports hx of anemia. Last hgb 11.0 on 03/29/24.       Review of Systems  Respiratory:  Positive for shortness of breath.        Objective:    BP 117/68   Pulse 85   Temp 98.1 F (36.7 C)   Ht 5' 6 (1.676 m)   Wt 180 lb (81.6 kg)   LMP 07/24/2024 (Exact Date)   SpO2 99%   BMI 29.05 kg/m    Physical Exam Vitals reviewed.  Constitutional:      Appearance: She is well-developed.  HENT:     Head: Normocephalic and atraumatic.     Mouth/Throat:     Mouth: Mucous membranes are moist.     Pharynx: Oropharynx is clear.     Comments: No tonsillar hypertrophy Eyes:     Extraocular Movements: Extraocular  movements intact.     Pupils: Pupils are equal, round, and reactive to light.  Cardiovascular:     Rate and Rhythm: Normal rate and regular rhythm.     Heart sounds: No murmur heard. Pulmonary:     Effort: Pulmonary effort is normal. No tachypnea, accessory muscle usage or respiratory distress.     Breath sounds: Normal breath sounds.  Musculoskeletal:        General: Normal range of motion.     Cervical back: Normal range of motion.  Skin:    General: Skin is warm and dry.  Neurological:     General: No focal deficit present.     Mental Status: She is alert and oriented to person, place, and time.  Psychiatric:        Mood and Affect: Mood normal.        Behavior: Behavior normal.       No results found for any visits on 08/14/24.     Assessment & Plan:   Problem List Items Addressed This Visit   None Visit Diagnoses       Shortness of breath    -  Primary   Relevant Orders   CBC with Differential   Basic Metabolic Panel  EKG   EKG 12-Lead (Completed)     History of anemia       Relevant Orders   CBC with Differential   Iron, TIBC and Ferritin Panel   DG Chest 2 View       Assessment and Plan    Acute shortness of breath and wheezing - No adventitious breath sounds on exam today.  - Symptoms have improved post prednisone  and albuterol  treatment given by UC on 08/06/24 but have not completely resolved. Discussed that symptoms may have been caused by a viral illness but would run some test to rule out other causes.  - Ordered CBC, results pending - Ordered iron panel, results pending - Ordered BMP, results pending - Chest xray - no acute abnormalities observed today. Awaiting final readout from radiology.  - 12 lead EKG - Sinus Rhythm - With rate variation.   - No acute abnormality on EKG.  - Discussed if palpitations increase in frequency to f/u with PCP.  - Follow up if symptoms acutely worsen, no improvement over the next 2-3 days, fever develops, or for  any other concerns - F/u with pcp for persistent symptoms or concerns          No orders of the defined types were placed in this encounter.   Return if symptoms worsen or fail to improve.  Oneil LELON Severin, FNP [facility]

## 2024-08-14 NOTE — Telephone Encounter (Signed)
 No openings at PCP office today--Patient scheduled at [facility]  FYI Only or Action Required?: FYI only for provider: appointment scheduled on 08/14/2024 at 3:30pm at [facility] with Oneil Severin FNP .  Patient was last seen in primary care on 03/29/2024 by Terry Wilhelmena Lloyd Hilario, FNP.  Called Nurse Triage reporting Shortness of Breath.  Symptoms began 08/05/2024.  Interventions attempted: Prescription medications: Albuterol , Prednisone  and Other: went to Urgent Care on 08/06/2024.  Symptoms are: better but still present.  Triage Disposition: See HCP Within 4 Hours (Or PCP Triage)  Patient/caregiver understands and will follow disposition?: Yes---scheduled at [facility]               Copied from CRM [id number]. Topic: Clinical - Red Word Triage >> Aug 14, 2024  9:13 AM Antwanette L wrote: Red Word that prompted transfer to Nurse Triage: Pt is experiencing sob. Pt needs to schedule an uc follow up . Reason for Disposition . [1] MILD difficulty breathing (e.g., minimal/no SOB at rest, SOB with walking, pulse < 100) AND [2] NEW-onset or WORSE than normal  Answer Assessment - Initial Assessment Questions Patient went to Urgent Care on 08/06/2024 and was prescribed an Albuterol  Inhaler and Prednisone  after having shortness of breath Patient was in a parade on 08/05/2024 and started having shortness of breath Patient states she has not had to use her inhaler in the past few days but still is experiencing shortness of breath mostly on exertion She denies any known fevers, sneezing, coughing, or runny nose  Patient states she is at work today and cannot come for an appointment until after 2pm No openings in PCP office today--Patient scheduled at [facility]  Patient is advised to call us  back if anything changes or with any further questions/concerns. Patient is advised that if anything  worsens to go to the Emergency Room. Patient verbalized understanding.  Protocols used: Breathing Difficulty-A-AH

## 2024-08-14 NOTE — Telephone Encounter (Signed)
Noted patient scheduled

## 2024-08-15 ENCOUNTER — Ambulatory Visit: Payer: Self-pay | Admitting: Family Medicine

## 2024-08-15 LAB — CBC WITH DIFFERENTIAL/PLATELET
Basophils Absolute: 0.1 x10E3/uL (ref 0.0–0.2)
Basos: 1 %
EOS (ABSOLUTE): 0.3 x10E3/uL (ref 0.0–0.4)
Eos: 3 %
Hematocrit: 34.5 % (ref 34.0–46.6)
Hemoglobin: 11.1 g/dL (ref 11.1–15.9)
Immature Grans (Abs): 0.1 x10E3/uL (ref 0.0–0.1)
Immature Granulocytes: 1 %
Lymphocytes Absolute: 3.1 x10E3/uL (ref 0.7–3.1)
Lymphs: 31 %
MCH: 26.2 pg — ABNORMAL LOW (ref 26.6–33.0)
MCHC: 32.2 g/dL (ref 31.5–35.7)
MCV: 81 fL (ref 79–97)
Monocytes Absolute: 0.9 x10E3/uL (ref 0.1–0.9)
Monocytes: 9 %
Neutrophils Absolute: 5.7 x10E3/uL (ref 1.4–7.0)
Neutrophils: 55 %
Platelets: 305 x10E3/uL (ref 150–450)
RBC: 4.24 x10E6/uL (ref 3.77–5.28)
RDW: 13.1 % (ref 11.7–15.4)
WBC: 10.1 x10E3/uL (ref 3.4–10.8)

## 2024-08-15 LAB — IRON,TIBC AND FERRITIN PANEL
Ferritin: 84 ng/mL (ref 15–150)
Iron Saturation: 14 % — ABNORMAL LOW (ref 15–55)
Iron: 43 ug/dL (ref 27–159)
Total Iron Binding Capacity: 306 ug/dL (ref 250–450)
UIBC: 263 ug/dL (ref 131–425)

## 2024-08-15 LAB — BASIC METABOLIC PANEL WITH GFR
BUN/Creatinine Ratio: 25 — ABNORMAL HIGH (ref 9–23)
BUN: 17 mg/dL (ref 6–20)
CO2: 26 mmol/L (ref 20–29)
Calcium: 9.5 mg/dL (ref 8.7–10.2)
Chloride: 102 mmol/L (ref 96–106)
Creatinine, Ser: 0.69 mg/dL (ref 0.57–1.00)
Glucose: 79 mg/dL (ref 70–99)
Potassium: 3.8 mmol/L (ref 3.5–5.2)
Sodium: 140 mmol/L (ref 134–144)
eGFR: 120 mL/min/1.73 (ref >59)

## 2024-12-06 ENCOUNTER — Ambulatory Visit: Admitting: Dermatology
